# Patient Record
Sex: Male | Born: 1985
Health system: Southern US, Community
[De-identification: ages and names within clinical notes are randomized; demographics above are authoritative.]

## PROBLEM LIST (undated history)

## (undated) DIAGNOSIS — Z789 Other specified health status: Secondary | ICD-10-CM

## (undated) DIAGNOSIS — K760 Fatty (change of) liver, not elsewhere classified: Secondary | ICD-10-CM

## (undated) HISTORY — DX: Fatty (change of) liver, not elsewhere classified: K76.0

## (undated) HISTORY — PX: OTHER SURGICAL HISTORY: SHX169

## (undated) HISTORY — DX: Other specified health status: Z78.9

---

## 2016-05-10 ENCOUNTER — Ambulatory Visit: Admitting: Family Medicine

## 2016-05-10 NOTE — Progress Notes (Signed)
* * *        **[name]**    --- ---    56 Y old Male, DOB: 26-Mar-1986    Account Number: [id number]    [address] , [place], [place]    Home: [phone]    Guarantor: [name] Insurance: [employer] Payer ID: [id number]    Appointment Facility: [facility] [place]        * * *    05/10/2016  Progress Notes: Gaye Alken    --- ---    ---        Current Medications    ---      None    ---      Past Medical History    ---       PTSD.        ---      Surgical History    ---      Denies Past Surgical History    ---      Family History    ---      Father: alive 33 yrs    ---    Mother: alive 44 yrs    ---    2 brother(s) , 1 sister(s) .    ---    Brother deceased of overdose.    ---      Social History    ---     Violeta Gelinas 2017:_    Previsit Prep discussed with care team/Quality Folder Updated Last updated on:  05/10/2016. Self referrals since last visit \--> No. Hospital/ED/SNF stay  since last visit \--> No. BMI Follow-Up Weight Assessment Findings:  Overweight, BMI management provided Yes, Above Normal BMI Follow-up Dietary  management education, guidance, and counseling. Depression Screening (PHQ2)  Little interest or pleasure in doing things? No, Feeling down, depressed or  hopeless? Yes. Depression Screening (PHQ9) Little interest or pleasure in  doing things More than half the days, Feeling down, depressed, or hopeless Not  at all, Trouble falling or staying asleep, or sleeping too much More than half  the days, Feeling tired or having little energy Several days, Poor appetite or  overeating Not at all, Feeling bad about yourself, or that you are a failure,  or have let yourself or your family down Not at all, Trouble concentrating on  things, such as reading the newspaper or watching television Several days,  Moving or speaking so slowly that other people could have noticed. Or the  opposite being so fidgety or restless that you have been moving around a lot  more  than usual Not at all, Thoughts that you would be better off dead, or of  hurting yourself in some way Not at all, Total Score 6, Interpretation Mild  Depression. Smoking Are you a: nonsmoker.    _Family, Social and Culture:_    Alcohol Screening Did you have a drink containing alcohol in the past year?  Yes, How often did you have a drink containing alcohol in the past year? Two  to four times a month (2 points), How many drinks did you have on a typucal  day when you were drinking in the past year? 1 or 2 (0 points), How often did  you have six or more drinks on one occasion in the past year? Never (0  points), Points 2\. Communication Limitations: None. Education \--> finished  college. Exercise Frequency: Occasionally. Housing Number of adults in  household: 1\. Marital Status \--> single. Nutrition Diet no special  diet.  Occupation Employment Status: Employed, --> Production designer, theatre/television/film.    _Behavioral Health:_    Family History Family History Of Substance Abuse: No, Family History of Mental  Health: No.    _Sexual History:_    Sexual History Had sex in the past 12 months (vaginal, oral, or anal)? Yes,  with Women only.      Allergies    ---      N.K.D.A.    ---      Hospitalization/Major Diagnostic Procedure    ---      Denies Past Hospitalization    ---      Review of Systems    ---     _ANNUAL_ROS_ :    CONST: NEGATIVE FOR:, Fevers, Chills, Fatigue. EYES: NEGATIVE FOR: , Vision  changes, Eye Pain. EARS/NOSE/THROAT NEGATIVE FOR:, hearing loss , nasal  stuffiness. CV: NEGATIVE FOR:, Chest Pain, Palpitations . RESPIRATORY:  NEGATIVE FOR: , Cough, Excessive snoring, Wheeze. GI: NEGATIVE FOR: ,  abdominal pain , nausea, vomitting. NEURO: NEGATIVE FOR: , HA, Dizziness.            Reason for Appointment    ---      1\. Needs new pcp    ---      History of Present Illness    ---     _-HPI-_ :    Pt is here for establishment. Pt is not smoking. Pt lost his brother in April  and has been dealing with grief and would like to see  Psych. pt had hx of  PTSD. Pt has athlete foot and pt did take pill long time ago and would like to  try again.      Vital Signs    ---    BP sitting 117/72 mm Hg, Pulse sitting 66, Temp 98.6, Wt 198, Ht 68 in, BMI  30.10 Index, Oxygen sat % 98 %, Ht-cm 172.72 cm, Wt-kg 89.81.      Examination    ---     _Exam (R.G.)_ :    GENERAL APPEARANCE: no acute distress.    HEAD: normocephalic atraumatic.    SKIN: Couple of toe nails thickened. Macular rash on foot and between toes.  scaly.[email or web address] Kitchen    CARDIOVASCULAR: regular rate and rhythm, no murmur.    RESPIRATORY: clear to auscultation.    CVA tenderness: No.    EXTREMITIES no edema.    PSYCH: Good eye contact, appropriate affect, appropriate mood, alert.          Assessments    ---    1\. PTSD (post-traumatic stress disorder) - F43.10 (Primary)    ---    2\. Encounter for immunization - Z23    ---    3\. Fatigue, unspecified type - R53.83    ---    4\. Venereal disease screening - Z11.3    ---    5\. Tinea pedis of both feet - B35.3    ---      List of counselor given. will check STD's and want to be checked for mono.  Lamisil Rx. , **Medication side effects discussed with the patient and  understands, F/U 2wks for physical.    ---      Treatment    ---       **1\. Fatigue, unspecified type**    _LAB: COMPREHENSIVE METABOLIC PANEL_    _LAB: EBV ACUTE INFECTION ANTIBODIES_    ---         **2\. Venereal disease screening**    _LAB: CHLAMYDIA/GC APTIMA NAA,  URINE_    _LAB: HEPATITIS PANEL_    _LAB: HIV AG AB_    _LAB: HSV TYPE 2 SPECIFIC IGG_    _LAB: RPR TITER_         **3\. Tinea pedis of both feet**    Start Terbinafine HCl Tablet, 250 MG, 1 tablet, Orally, Once a day, 14 days,  14 Tablet, Refills 0      Immunization    ---      Tdap : 0.5 mL (Route: Intramuscular) given by Shary Key on Left Deltoid  (Encounter for immunization)    ---      Procedure Codes    ---      [phone] Tdap    ---    [phone] IMMUNIZATION ADMIN    ---      Follow Up    ---    2 Weeks (Reason: Annual)     Electronically signed by Gaye Alken , MD on 05/10/2016 at 02:00 PM EDT    Sign off status: Completed        * * [facility] [place]    [address]    [place], [place] [address]    Tel: [phone]    Fax: [phone]              * * *          Patient: Nadim, Malia DOB: 09-27-1985 Progress Note: Gaye Alken  05/10/2016    ---    Note generated by eClinicalWorks EMR/PM Software ([email or web address])

## 2016-05-12 LAB — HX COMPREHENSIVE METABOLIC PANEL
HX ALBUMIN: 4.6 g/dL (ref 3.5–5.0)
HX ALK PHOS TOTAL: 75 U/L (ref 25–165)
HX ALT/SGPT: 41 U/L (ref 10–44)
HX ANION GAP: 17 mmol/L (ref 9–18)
HX AST/SGOT: 21 U/L (ref 10–44)
HX BILIRUBIN TOTAL: 0.5 mg/dL (ref 0.2–1.0)
HX BUN: 8 mg/dL (ref 8–21)
HX CALCIUM: 9.7 mg/dL (ref 8.6–10.3)
HX CARBON DIOXIDE: 24 mmol/L (ref 22–30)
HX CHLORIDE: 102 mmol/L (ref 96–104)
HX CREATININE: 0.93 mg/dL (ref 0.67–1.17)
HX GLUCOSE: 103 mg/dL (ref 70–105)
HX POTASSIUM: 4.5 mmol/L (ref 3.5–5.1)
HX SODIUM: 143 mmol/L (ref 135–145)
HX TOTAL PROTEIN: 7.3 g/dL (ref 6.2–8.4)

## 2016-05-12 LAB — HX EBV ACUTE INFECTION ANTIBODIES
HX EBV AB VCA, IGM: <36 U/mL (ref 0.0–35.9)
HX EBV EARLY ANTIGEN AB, IGG: 11 U/mL — ABNORMAL HIGH (ref 0.0–8.9)
HX EBV NUCLEAR ANTIGEN AB, IGG: >600 U/mL — ABNORMAL HIGH (ref 0.0–17.9)
HX EBV-VCA IGG, AB: 83.3 U/mL — ABNORMAL HIGH (ref 0.0–17.9)

## 2016-05-12 LAB — HX HEPATITIS A AB IGM: HX HEPATITIS A AB IGM: NONREACTIVE

## 2016-05-12 LAB — HX HIV AG AB: HX HIV AG AB: NONREACTIVE

## 2016-05-12 LAB — HX HEPATITIS B CORE IGM: HX HEPATITIS CORE IGM: NONREACTIVE

## 2016-05-12 LAB — HX ANTI-TREPONEMAL ELISA: HX RPR: NEGATIVE

## 2016-05-12 LAB — HX HEPATITIS C AB: HX HEPATITIS C AB: NONREACTIVE

## 2016-05-12 LAB — HX HEPATITIS B ANTIGEN: HX HBSAG: NONREACTIVE

## 2016-05-12 LAB — HX HSV TYPE 2 SPECIFIC IGG: HX HSV2IGG: <0.91

## 2016-05-13 LAB — HX CHLAMYDIA/GC APTIMA NAA, URINE
HX C.TRACHOMATIS NAA: NEGATIVE
HX N.GONORRHOEAE NAA: NEGATIVE

## 2016-05-24 ENCOUNTER — Ambulatory Visit: Admitting: Family Medicine

## 2016-05-24 ENCOUNTER — Ambulatory Visit (HOSPITAL_BASED_OUTPATIENT_CLINIC_OR_DEPARTMENT_OTHER)

## 2016-05-24 NOTE — Progress Notes (Signed)
* * *        **Vincent Oconnor**    --- ---    13 Y old Male, DOB: March 17, 1986    Account Number: [id number]    [address] , [place], [place]    Home: [phone]    Guarantor: Vincent Oconnor Insurance:  [employer] Payer ID: [id number]    Appointment Facility: [facility] [place]        * * *    05/24/2016  Progress Notes: Gaye Alken    --- ---    ---        Current Medications    ---    Taking     * Terbinafine HCl 250 MG Tablet 1 tablet Orally Once a day, stop date 05/24/2016    ---    * Medication List reviewed and reconciled with the patient    ---      Past Medical History    ---       PTSD.        ---      Surgical History    ---      Denies Past Surgical History    ---      Family History    ---      Father: alive 28 yrs    ---    Mother: alive 29 yrs    ---    2 brother(s) , 1 sister(s) .    ---    Brother deceased of overdose.    ---      Social History    ---     Violeta Gelinas 2017:_    Previsit Prep discussed with care team/Quality Folder Updated Last updated on:  05/24/2016. Self referrals since last visit \--> No. Hospital/ED/SNF stay  since last visit \--> No. BMI Follow-Up Weight Assessment Findings:  Overweight, BMI management provided Yes, Above Normal BMI Follow-up Dietary  management education, guidance, and counseling. Depression Screening (PHQ2)  Little interest or pleasure in doing things? No, Feeling down, depressed or  hopeless? Yes. Depression Screening (PHQ9) Total Score 6, Feeling bad about  yourself, or that you are a failure, or have let yourself or your family down  Not at all, Feeling down, depressed, or hopeless Not at all, Feeling tired or  having little energy Several days, Interpretation Mild Depression, Little  interest or pleasure in doing things More than half the days, Moving or  speaking so slowly that other people could have noticed. Or the opposite being  so fidgety or restless that you have been moving around a lot more than usual  Not at all,  Poor appetite or overeating Not at all, Thoughts that you would be  better off dead, or of hurting yourself in some way Not at all, Trouble  concentrating on things, such as reading the newspaper or watching television  Several days, Trouble falling or staying asleep, or sleeping too much More  than half the days. Depression Follow-Up Plan Depression Screening Findings  Negative, Follow-Up for Depression Mental health care management. Smoking Are  you a: nonsmoker.    _Family, Social and Culture:_    Alcohol Screening Did you have a drink containing alcohol in the past year?  Yes, How often did you have six or more drinks on one occasion in the past  year? Never (0 points), How many drinks did you have on a typucal day when you  were drinking in the past year? 1 or 2 (0 points), How often did  you have a  drink containing alcohol in the past year? Two to four times a month (2  points), Points 2\. Communication Limitations: None. Education \--> finished  college. Exercise Frequency: Occasionally. Housing Number of adults in  household: 1\. Marital Status \--> single. Nutrition Diet no special diet.  Occupation Employment Status: Employed, --Scientist, research (medical).    _Behavioral Health:_    Family History Family History Of Substance Abuse: No, Family History of Mental  Health: No.    _Sexual History:_    Sexual History Had sex in the past 12 months (vaginal, oral, or anal)? Yes,  with Women only.      Allergies    ---      N.K.D.A.    ---      Hospitalization/Major Diagnostic Procedure    ---      Denies Past Hospitalization    ---      Review of Systems    ---     _ANNUAL_ROS_ :    CONST: NEGATIVE FOR:, Fevers, Chills, Fatigue. EYES: NEGATIVE FOR: , Vision  changes, Eye Pain. EARS/NOSE/THROAT NEGATIVE FOR:, hearing loss , nasal  stuffiness. CV: NEGATIVE FOR:, Chest Pain, Palpitations . RESPIRATORY:  NEGATIVE FOR: , Cough, Excessive snoring, Wheeze. GI: NEGATIVE FOR: ,  abdominal pain , nausea, vomitting. MSK: NEGATIVE FOR: ,  Myalgias, Back pain.  NEURO: NEGATIVE FOR: , HA, Dizziness.            Reason for Appointment    ---      1\. Ann    ---      History of Present Illness    ---     _-HPI-_ :    Pt is here for physical. Pt is not smoking. Pt has been on terbinafine and pt  stated it helped for his athlete's foot. Pt denied any CP, any SOB.      Vital Signs    ---    BP sitting 110/70 mm Hg, Pulse sitting 81, Temp 98.5, Wt 199, Ht 68 in, BMI  30.25 Index, Oxygen sat % 98 %, Weight Change 1 lb, Ht-cm 172.72 cm, Wt-kg  90.27.      Examination    ---     _Exam (R.G.)_ :    GENERAL APPEARANCE: no acute distress .    HEAD: normocephalic atraumatic .    SKIN: Couple of toe nails thickened. Macular rash on foot and between toes.  scaly. Improving.    EYES: conjunctiva clear, non-icteric.    EARS: canals ok, tympanic membranes normal.    Oropharynx Moist Mucous Membranes.    NECK: no cervical or supraclavicular lymphadenopathy, no thyromegaly.    CARDIOVASCULAR: regular rate and rhythm, no murmur .    RESPIRATORY: clear to auscultation .    CVA tenderness: No .    GASTROINTESTINAL: abdomen soft non-tender.    ABDOMEN: bowel sounds normal, soft non-tender, no HSM.    EXTREMITIES no edema .    PERIPHERAL PULSES: pedal pulses 2+ bilaterally.    MUSCULOSKELETAL No joint swelling.    PSYCH: Good eye contact, appropriate affect, appropriate mood, alert .          Assessments    ---    1\. Adult general medical examination - Z00.00 (Primary)    ---    2\. Tinea pedis of both feet - B35.3    ---    3\. Encounter for antibody response examination - Z01.84    ---    4\. Encounter for immunization - Z23    ---  Flu given. Pt works as Surveyor, minerals in shots. WIll check Hep B  titer. Annually. Encourage to exercise on regular basis.    ---      Treatment    ---       **1\. Adult general medical examination**    Notes: Health screening - men - ages 38 to 28 material was published to  portal.    ---        **2\. Tinea pedis of both feet**     Refill Terbinafine HCl Tablet, 250 MG, 1 tablet, Orally, Once a day, 14 days,  14 Tablet, Refills 0         **3\. Encounter for antibody response examination**    _LAB: HBS AB_       Immunization    ---      Influenza : 0.5 mL (Route: Intramuscular) given by Shary Key on Left  Deltoid (Encounter for immunization)    ---      Procedure Codes    ---      [id number] Flucelvax Quad 4 and older    ---    [id number] IMMUNIZATION ADMIN    ---      Follow Up    ---    1 Year (Reason: Annual)    Electronically signed by Gaye Alken , MD on 05/24/2016 at 03:30 PM EDT    Sign off status: Completed        * * [facility] [place]    [address]    [place], [place] [address]    Tel: [phone]    Fax: [phone]              * * *          Patient: Vincent Oconnor, Vincent Oconnor DOB: May 17, 1986 Progress Note: Gaye Alken  05/24/2016    ---    Note generated by eClinicalWorks EMR/PM Software ([email or web address])

## 2016-05-25 LAB — HX HEPATITIS B ANTIBODY
HX HEP BS AB UNIT: >1000 m[IU]/mL
HX HEP BS AB: REACTIVE

## 2016-06-10 ENCOUNTER — Ambulatory Visit: Admitting: Cardiovascular Disease

## 2016-06-11 ENCOUNTER — Ambulatory Visit

## 2016-06-13 ENCOUNTER — Ambulatory Visit

## 2017-05-24 ENCOUNTER — Ambulatory Visit: Admitting: Family Medicine

## 2017-05-24 NOTE — Progress Notes (Signed)
* * *        **  [name]**    --- ---    35 Y old Male, DOB: 04-02-1986    [address] , [place], [place], [place] [address]    Home: [phone]    Provider: Gaye Alken        * * *    Telephone Encounter    ---    Answered by   Blima Rich  Date: 05/24/2017         Time: 01:30 PM    Reason   Annual O/R    --- ---            Message                      sent                     * * *                ---          * * *          Patient: Dustin, Bumbaugh DOB: [birth year] Provider: Gaye Alken 05/24/2017    ---    Note generated by eClinicalWorks EMR/PM Software ([email or web address])

## 2017-11-15 ENCOUNTER — Telehealth: Payer: Self-pay | Admitting: General Practice

## 2017-11-15 NOTE — Telephone Encounter (Signed)
Left voicemail for pt to call us back to set up new patient appt.

## 2017-11-16 ENCOUNTER — Encounter: Payer: Self-pay | Admitting: Family Medicine

## 2017-11-16 ENCOUNTER — Ambulatory Visit: Payer: 59 | Admitting: Family Medicine

## 2017-11-16 VITALS — BP 104/68 | HR 56 | Temp 98.0°F | Ht 68.5 in | Wt 201.4 lb

## 2017-11-16 DIAGNOSIS — Z0000 Encounter for general adult medical examination without abnormal findings: Secondary | ICD-10-CM | POA: Diagnosis not present

## 2017-11-16 DIAGNOSIS — B353 Tinea pedis: Secondary | ICD-10-CM | POA: Diagnosis not present

## 2017-11-16 DIAGNOSIS — Z0001 Encounter for general adult medical examination with abnormal findings: Secondary | ICD-10-CM | POA: Diagnosis not present

## 2017-11-16 MED ORDER — KETOCONAZOLE 2 % EX CREA
TOPICAL_CREAM | CUTANEOUS | 0 refills | Status: AC
Start: 2017-11-16 — End: 2017-12-28

## 2017-11-16 MED FILL — KETOCONAZOLE 2% CREAM: 2 | 42 days supply | Qty: 60 | Fill #0

## 2017-11-16 NOTE — Patient Instructions (Addendum)
1-2 days for your labs.  Keep up the good work.   Let us know if you need anything.  EXERCISES  RANGE OF MOTION (ROM) AND STRETCHING EXERCISES - Low Back Prain Most people with lower back pain will find that their symptoms get worse with excessive bending forward (flexion) or arching at the lower back (extension). The exercises that will help resolve your symptoms will focus on the opposite motion.  If you have pain, numbness or tingling which travels down into your buttocks, leg or foot, the goal of the therapy is for these symptoms to move closer to your back and eventually resolve. Sometimes, these leg symptoms will get better, but your lower back pain may worsen. This is often an indication of progress in your rehabilitation. Be very alert to any changes in your symptoms and the activities in which you participated in the 24 hours prior to the change. Sharing this information with your caregiver will allow him or her to most efficiently treat your condition. These exercises may help you when beginning to rehabilitate your injury. Your symptoms may resolve with or without further involvement from your physician, physical therapist or athletic trainer. While completing these exercises, remember:   Restoring tissue flexibility helps normal motion to return to the joints. This allows healthier, less painful movement and activity.  An effective stretch should be held for at least 30 seconds.  A stretch should never be painful. You should only feel a gentle lengthening or release in the stretched tissue. FLEXION RANGE OF MOTION AND STRETCHING EXERCISES:  STRETCH - Flexion, Single Knee to Chest   Lie on a firm bed or floor with both legs extended in front of you.  Keeping one leg in contact with the floor, bring your opposite knee to your chest. Hold your leg in place by either grabbing behind your thigh or at your knee.  Pull until you feel a gentle stretch in your low back. Hold 30  seconds.  Slowly release your grasp and repeat the exercise with the opposite side. Repeat 2 times. Complete this exercise 3 times per week.   STRETCH - Flexion, Double Knee to Chest  Lie on a firm bed or floor with both legs extended in front of you.  Keeping one leg in contact with the floor, bring your opposite knee to your chest.  Tense your stomach muscles to support your back and then lift your other knee to your chest. Hold your legs in place by either grabbing behind your thighs or at your knees.  Pull both knees toward your chest until you feel a gentle stretch in your low back. Hold 30 seconds.  Tense your stomach muscles and slowly return one leg at a time to the floor. Repeat 2 times. Complete this exercise 3 times per week.   STRETCH - Low Trunk Rotation  Lie on a firm bed or floor. Keeping your legs in front of you, bend your knees so they are both pointed toward the ceiling and your feet are flat on the floor.  Extend your arms out to the side. This will stabilize your upper body by keeping your shoulders in contact with the floor.  Gently and slowly drop both knees together to one side until you feel a gentle stretch in your low back. Hold for 30 seconds.  Tense your stomach muscles to support your lower back as you bring your knees back to the starting position. Repeat the exercise to the other side. Repeat 2 times.  Complete this exercise at least 3 times per week.   EXTENSION RANGE OF MOTION AND FLEXIBILITY EXERCISES:  STRETCH - Extension, Prone on Elbows   Lie on your stomach on the floor, a bed will be too soft. Place your palms about shoulder width apart and at the height of your head.  Place your elbows under your shoulders. If this is too painful, stack pillows under your chest.  Allow your body to relax so that your hips drop lower and make contact more completely with the floor.  Hold this position for 30 seconds.  Slowly return to lying flat on the  floor. Repeat 2 times. Complete this exercise 3 times per week.   RANGE OF MOTION - Extension, Prone Press Ups  Lie on your stomach on the floor, a bed will be too soft. Place your palms about shoulder width apart and at the height of your head.  Keeping your back as relaxed as possible, slowly straighten your elbows while keeping your hips on the floor. You may adjust the placement of your hands to maximize your comfort. As you gain motion, your hands will come more underneath your shoulders.  Hold this position 30 seconds.  Slowly return to lying flat on the floor. Repeat 2 times. Complete this exercise 3 times per week.   RANGE OF MOTION- Quadruped, Neutral Spine   Assume a hands and knees position on a firm surface. Keep your hands under your shoulders and your knees under your hips. You may place padding under your knees for comfort.  Drop your head and point your tailbone toward the ground below you. This will round out your lower back like an angry cat. Hold this position for 30 seconds.  Slowly lift your head and release your tail bone so that your back sags into a large arch, like an old horse.  Hold this position for 30 seconds.  Repeat this until you feel limber in your low back.  Now, find your "sweet spot." This will be the most comfortable position somewhere between the two previous positions. This is your neutral spine. Once you have found this position, tense your stomach muscles to support your low back.  Hold this position for 30 seconds. Repeat 2 times. Complete this exercise 3 times per week.   STRENGTHENING EXERCISES - Low Back Sprain These exercises may help you when beginning to rehabilitate your injury. These exercises should be done near your "sweet spot." This is the neutral, low-back arch, somewhere between fully rounded and fully arched, that is your least painful position. When performed in this safe range of motion, these exercises can be used for people  who have either a flexion or extension based injury. These exercises may resolve your symptoms with or without further involvement from your physician, physical therapist or athletic trainer. While completing these exercises, remember:   Muscles can gain both the endurance and the strength needed for everyday activities through controlled exercises.  Complete these exercises as instructed by your physician, physical therapist or athletic trainer. Increase the resistance and repetitions only as guided.  You may experience muscle soreness or fatigue, but the pain or discomfort you are trying to eliminate should never worsen during these exercises. If this pain does worsen, stop and make certain you are following the directions exactly. If the pain is still present after adjustments, discontinue the exercise until you can discuss the trouble with your caregiver.  STRENGTHENING - Deep Abdominals, Pelvic Tilt   Lie on a firm  bed or floor. Keeping your legs in front of you, bend your knees so they are both pointed toward the ceiling and your feet are flat on the floor.  Tense your lower abdominal muscles to press your low back into the floor. This motion will rotate your pelvis so that your tail bone is scooping upwards rather than pointing at your feet or into the floor. With a gentle tension and even breathing, hold this position for 3 seconds. Repeat 2 times. Complete this exercise 3 times per week.   STRENGTHENING - Abdominals, Crunches   Lie on a firm bed or floor. Keeping your legs in front of you, bend your knees so they are both pointed toward the ceiling and your feet are flat on the floor. Cross your arms over your chest.  Slightly tip your chin down without bending your neck.  Tense your abdominals and slowly lift your trunk high enough to just clear your shoulder blades. Lifting higher can put excessive stress on the lower back and does not further strengthen your abdominal  muscles.  Control your return to the starting position. Repeat 2 times. Complete this exercise 3 times per week.   STRENGTHENING - Quadruped, Opposite UE/LE Lift   Assume a hands and knees position on a firm surface. Keep your hands under your shoulders and your knees under your hips. You may place padding under your knees for comfort.  Find your neutral spine and gently tense your abdominal muscles so that you can maintain this position. Your shoulders and hips should form a rectangle that is parallel with the floor and is not twisted.  Keeping your trunk steady, lift your right hand no higher than your shoulder and then your left leg no higher than your hip. Make sure you are not holding your breath. Hold this position for 30 seconds.  Continuing to keep your abdominal muscles tense and your back steady, slowly return to your starting position. Repeat with the opposite arm and leg. Repeat 2 times. Complete this exercise 3 times per week.   STRENGTHENING - Abdominals and Quadriceps, Straight Leg Raise   Lie on a firm bed or floor with both legs extended in front of you.  Keeping one leg in contact with the floor, bend the other knee so that your foot can rest flat on the floor.  Find your neutral spine, and tense your abdominal muscles to maintain your spinal position throughout the exercise.  Slowly lift your straight leg off the floor about 6 inches for a count of 3, making sure to not hold your breath.  Still keeping your neutral spine, slowly lower your leg all the way to the floor. Repeat this exercise with each leg 2 times. Complete this exercise 3 times per week.  POSTURE AND BODY MECHANICS CONSIDERATIONS - Low Back Sprain Keeping correct posture when sitting, standing or completing your activities will reduce the stress put on different body tissues, allowing injured tissues a chance to heal and limiting painful experiences. The following are general guidelines for improved  posture.  While reading these guidelines, remember:  The exercises prescribed by your provider will help you have the flexibility and strength to maintain correct postures.  The correct posture provides the best environment for your joints to work. All of your joints have less wear and tear when properly supported by a spine with good posture. This means you will experience a healthier, less painful body.  Correct posture must be practiced with all of your activities,  especially prolonged sitting and standing. Correct posture is as important when doing repetitive low-stress activities (typing) as it is when doing a single heavy-load activity (lifting).  RESTING POSITIONS Consider which positions are most painful for you when choosing a resting position. If you have pain with flexion-based activities (sitting, bending, stooping, squatting), choose a position that allows you to rest in a less flexed posture. You would want to avoid curling into a fetal position on your side. If your pain worsens with extension-based activities (prolonged standing, working overhead), avoid resting in an extended position such as sleeping on your stomach. Most people will find more comfort when they rest with their spine in a more neutral position, neither too rounded nor too arched. Lying on a non-sagging bed on your side with a pillow between your knees, or on your back with a pillow under your knees will often provide some relief. Keep in mind, being in any one position for a prolonged period of time, no matter how correct your posture, can still lead to stiffness.  PROPER SITTING POSTURE In order to minimize stress and discomfort on your spine, you must sit with correct posture. Sitting with good posture should be effortless for a healthy body. Returning to good posture is a gradual process. Many people can work toward this most comfortably by using various supports until they have the flexibility and strength to  maintain this posture on their own. When sitting with proper posture, your ears will fall over your shoulders and your shoulders will fall over your hips. You should use the back of the chair to support your upper back. Your lower back will be in a neutral position, just slightly arched. You may place a small pillow or folded towel at the base of your lower back for  support.  When working at a desk, create an environment that supports good, upright posture. Without extra support, muscles tire, which leads to excessive strain on joints and other tissues. Keep these recommendations in mind:  CHAIR:  A chair should be able to slide under your desk when your back makes contact with the back of the chair. This allows you to work closely.  The chair's height should allow your eyes to be level with the upper part of your monitor and your hands to be slightly lower than your elbows.  BODY POSITION  Your feet should make contact with the floor. If this is not possible, use a foot rest.  Keep your ears over your shoulders. This will reduce stress on your neck and low back.  INCORRECT SITTING POSTURES  If you are feeling tired and unable to assume a healthy sitting posture, do not slouch or slump. This puts excessive strain on your back tissues, causing more damage and pain. Healthier options include:  Using more support, like a lumbar pillow.  Switching tasks to something that requires you to be upright or walking.  Talking a brief walk.  Lying down to rest in a neutral-spine position.  PROLONGED STANDING WHILE SLIGHTLY LEANING FORWARD  When completing a task that requires you to lean forward while standing in one place for a long time, place either foot up on a stationary 2-4 inch high object to help maintain the best posture. When both feet are on the ground, the lower back tends to lose its slight inward curve. If this curve flattens (or becomes too large), then the back and your other joints  will experience too much stress, tire more quickly, and can  cause pain.  CORRECT STANDING POSTURES Proper standing posture should be assumed with all daily activities, even if they only take a few moments, like when brushing your teeth. As in sitting, your ears should fall over your shoulders and your shoulders should fall over your hips. You should keep a slight tension in your abdominal muscles to brace your spine. Your tailbone should point down to the ground, not behind your body, resulting in an over-extended swayback posture.   INCORRECT STANDING POSTURES  Common incorrect standing postures include a forward head, locked knees and/or an excessive swayback. WALKING Walk with an upright posture. Your ears, shoulders and hips should all line-up.  PROLONGED ACTIVITY IN A FLEXED POSITION When completing a task that requires you to bend forward at your waist or lean over a low surface, try to find a way to stabilize 3 out of 4 of your limbs. You can place a hand or elbow on your thigh or rest a knee on the surface you are reaching across. This will provide you more stability, so that your muscles do not tire as quickly. By keeping your knees relaxed, or slightly bent, you will also reduce stress across your lower back. CORRECT LIFTING TECHNIQUES  DO :  Assume a wide stance. This will provide you more stability and the opportunity to get as close as possible to the object which you are lifting.  Tense your abdominals to brace your spine. Bend at the knees and hips. Keeping your back locked in a neutral-spine position, lift using your leg muscles. Lift with your legs, keeping your back straight.  Test the weight of unknown objects before attempting to lift them.  Try to keep your elbows locked down at your sides in order get the best strength from your shoulders when carrying an object.     Always ask for help when lifting heavy or awkward objects. INCORRECT LIFTING TECHNIQUES DO NOT:    Lock your knees when lifting, even if it is a small object.  Bend and twist. Pivot at your feet or move your feet when needing to change directions.  Assume that you can safely pick up even a paperclip without proper posture.

## 2017-11-16 NOTE — Progress Notes (Signed)
Pre visit review using our clinic review tool, if applicable. No additional management support is needed unless otherwise documented below in the visit note. 

## 2017-11-16 NOTE — Progress Notes (Signed)
Chief Complaint  Patient presents with  . Establish Care    Well Male Jimmy Barrett is here for a complete physical.   His last physical was >1 year ago.  Current diet: in general, a "healthy" diet   Current exercise: Some weight lifting, cardio 5 days/week Weight trend: stable, trying to lose some  Does pt snore? Yes, no witnessed apneic episodes. Daytime fatigue? No. Seat belt? Yes.    Health maintenance Tetanus- Yes Apr 12, 2016 HIV- Yes - Nov 16, 2004  History reviewed. No pertinent past medical history.  Past Surgical History:  Procedure Laterality Date  . OTHER SURGICAL HISTORY     [place] from [place]   Medications  Takes no meds routinely.  Allergies No Known Allergies   Family History Family History  Problem Relation Age of Onset  . Cancer Father        colon cancer    Review of Systems: Constitutional: no fevers or chills Eye:  no recent significant change in vision Ear/Nose/Mouth/Throat:  Ears:  no tinnitus or recent hearing loss Nose/Mouth/Throat:  no complaints of nasal congestion or bleeding, no sore throat Cardiovascular:  no chest pain, no palpitations Respiratory:  no cough and no shortness of breath Gastrointestinal:  no abdominal pain, no change in bowel habits, no nausea, vomiting, diarrhea, or constipation and no black or bloody stool GU:  Male: negative for dysuria, frequency, and incontinence and negative for prostate symptoms Musculoskeletal/Extremities:  no pain, redness, or swelling of the joints Integumentary (Skin/Breast): +athlete's foot on both feet; otherwise no abnormal skin lesions reported Neurologic:  no headaches, no numbness, tingling Endocrine: No unexpected weight changes Hematologic/Lymphatic:  no night sweats  Exam BP 104/68 (BP Location: Left Arm, Patient Position: Sitting, Cuff Size: Large)   Pulse (!) 56   Temp 98 F (36.7 C) (Oral)   Ht 5' 8.5" (1.74 m)   Wt 201 lb 6 oz (91.3 kg)   SpO2 96%    BMI 30.17 kg/m  General:  well developed, well nourished, in no apparent distress Skin:  no significant moles, warts, or growths Head:  no masses, lesions, or tenderness Eyes:  pupils equal and round, sclera anicteric without injection Ears:  canals without lesions, TMs shiny without retraction, no obvious effusion, no erythema Nose:  nares patent, septum midline, mucosa normal Throat/Pharynx:  lips and gingiva without lesion; tongue and uvula midline; non-inflamed pharynx; no exudates or postnasal drainage Neck: neck supple without adenopathy, thyromegaly, or masses Lungs:  clear to auscultation, breath sounds equal bilaterally, no respiratory distress Cardio:  regular rate and rhythm without murmurs, heart sounds without clicks or rubs Abdomen:  abdomen soft, nontender; bowel sounds normal; no masses or organomegaly Genital (male): circumcised penis, no lesions or discharge; testes present bilaterally without masses or tenderness Rectal: Deferred Musculoskeletal:  symmetrical muscle groups noted without atrophy or deformity Extremities:  no clubbing, cyanosis, or edema, no deformities, no skin discoloration Neuro:  gait normal; deep tendon reflexes normal and symmetric Psych: well oriented with normal range of affect and appropriate judgment/insight  Assessment and Plan  Well adult exam - Plan: Comprehensive metabolic panel, Lipid panel  Tinea pedis of both feet - Plan: ketoconazole (NIZORAL) 2 % cream   Well 32 y.o. male. Counseled on diet and exercise. Doing well as of late. Other orders as above. Follow up in 1 year pending the above workup. The patient voiced understanding and agreement to the plan.  Jilda Rocheicholas Paul FarlingtonWendling, DO 11/16/17 3:03 PM

## 2017-11-17 LAB — LIPID PANEL
CHOL/HDL RATIO: 6
Cholesterol: 174 mg/dL (ref 0–200)
HDL: 31 mg/dL — ABNORMAL LOW (ref >39.00)
LDL CALC: 104 mg/dL — AB (ref 0–99)
NonHDL: 142.67
Triglycerides: 192 mg/dL — ABNORMAL HIGH (ref 0.0–149.0)
VLDL: 38.4 mg/dL (ref 0.0–40.0)

## 2017-11-17 LAB — COMPREHENSIVE METABOLIC PANEL
ALT: 45 U/L (ref 0–53)
AST: 22 U/L (ref 0–37)
Albumin: 4.4 g/dL (ref 3.5–5.2)
Alkaline Phosphatase: 58 U/L (ref 39–117)
BILIRUBIN TOTAL: 0.9 mg/dL (ref 0.2–1.2)
BUN: 15 mg/dL (ref 6–23)
CALCIUM: 9.5 mg/dL (ref 8.4–10.5)
CHLORIDE: 105 meq/L (ref 96–112)
CO2: 26 meq/L (ref 19–32)
CREATININE: 0.88 mg/dL (ref 0.40–1.50)
GFR: 106.52 mL/min (ref >60.00)
GLUCOSE: 86 mg/dL (ref 70–99)
Potassium: 3.7 mEq/L (ref 3.5–5.1)
SODIUM: 140 meq/L (ref 135–145)
Total Protein: 7.2 g/dL (ref 6.0–8.3)

## 2018-01-06 ENCOUNTER — Other Ambulatory Visit: Payer: Self-pay

## 2018-01-06 ENCOUNTER — Emergency Department (HOSPITAL_COMMUNITY): Payer: PRIVATE HEALTH INSURANCE

## 2018-01-06 ENCOUNTER — Emergency Department (HOSPITAL_COMMUNITY)
Admission: EM | Admit: 2018-01-06 | Discharge: 2018-01-06 | Disposition: A | Discharge status: Home / Self Care | Payer: PRIVATE HEALTH INSURANCE | Attending: Emergency Medicine | Admitting: Emergency Medicine

## 2018-01-06 ENCOUNTER — Encounter (HOSPITAL_COMMUNITY): Payer: Self-pay | Admitting: Emergency Medicine

## 2018-01-06 DIAGNOSIS — Y990 Civilian activity done for income or pay: Secondary | ICD-10-CM | POA: Insufficient documentation

## 2018-01-06 DIAGNOSIS — M7532 Calcific tendinitis of left shoulder: Secondary | ICD-10-CM | POA: Insufficient documentation

## 2018-01-06 DIAGNOSIS — Y9389 Activity, other specified: Secondary | ICD-10-CM | POA: Diagnosis not present

## 2018-01-06 DIAGNOSIS — W010XXA Fall on same level from slipping, tripping and stumbling without subsequent striking against object, initial encounter: Secondary | ICD-10-CM | POA: Insufficient documentation

## 2018-01-06 DIAGNOSIS — Y92239 Unspecified place in hospital as the place of occurrence of the external cause: Secondary | ICD-10-CM | POA: Diagnosis not present

## 2018-01-06 DIAGNOSIS — M25512 Pain in left shoulder: Secondary | ICD-10-CM | POA: Diagnosis present

## 2018-01-06 NOTE — ED Notes (Signed)
Pt has been evaluated by Irving Burton  PA, awaiting for discharge.

## 2018-01-06 NOTE — ED Triage Notes (Signed)
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

## 2018-01-06 NOTE — Discharge Instructions (Addendum)
X-ray was reassuring.  No broken bones.  Please take 600 mg ibuprofen every 6 hours as needed for pain.  You can also take Tylenol.  Please apply ice over the shoulder at least twice a day for 15 minutes at a time.  Schedule an appointment with the orthopedic doctor in a week if your symptoms are not improving.

## 2018-01-06 NOTE — ED Provider Notes (Signed)
[facility]-EMERGENCY DEPT Provider Note   CSN: [id number] Arrival date & time: 01/06/18  0542     History   Chief Complaint Chief Complaint  Patient presents with  . Shoulder Pain    HPI Jimmy Barrett is a 32 y.o. male.  HPI   Jimmy. Barrett is a 32yo male with no significant past medical history who presents to the emergency department for evaluation of left shoulder injury.  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  Reports he fell on his left shoulder.  Initially he felt okay, but states about 30 minutes after falling he felt generalized left shoulder soreness.  States that his shoulder pain is located generally over the head of the humerus and is a 4/10 in severity at this time. Pain is worsened when he abducts the shoulder beyond 45 degrees. He has not taken any medication for the pain. He denies numbness, weakness, open wound, fever. Denies hitting his head or LOC.   History reviewed. No pertinent past medical history.  There are no active problems to display for this patient.   Past Surgical History:  Procedure Laterality Date  . OTHER SURGICAL HISTORY     Shrapnel removed from leg in [place]        Home Medications    Prior to Admission medications   Not on File    Family History Family History  Problem Relation Age of Onset  . Cancer Father        colon cancer    Social History Social History   Tobacco Use  . Smoking status: Never Smoker  . Smokeless tobacco: Never Used  Substance Use Topics  . Alcohol use: Yes    Comment: socially  . Drug use: No     Allergies   Patient has no known allergies.   Review of Systems Review of Systems  Constitutional: Negative for chills and fever.  Musculoskeletal: Positive for arthralgias (left shoulder). Negative for joint swelling.  Skin: Negative for color change, rash and wound.  Neurological: Negative for  weakness and numbness.     Physical Exam Updated Vital Signs BP 131/82 (BP Location: Right Arm)   Pulse 63   Resp 16   Ht  (1.727 m)   Wt 81.6 kg (180 lb)   SpO2 98%   BMI 27.37 kg/m   Physical Exam  Constitutional: He is oriented to person, place, and time. He appears well-developed and well-nourished. No distress.  HENT:  Head: Normocephalic and atraumatic.  Eyes: Right eye exhibits no discharge. Left eye exhibits no discharge.  Pulmonary/Chest: Effort normal. No respiratory distress.  Musculoskeletal:  Left shoulder non-tender to palpation. Full active ROM, although he reports tenderness with abduction. Negative empty can test, negative Neer's, negative Lift off. No swelling, erythema or ecchymosis present. No step-off, crepitus, or deformity appreciated. 5/5 muscle strength of UE. 2+ radial pulse. Sensation to light touch intact in bilateral UE.   Neurological: He is alert and oriented to person, place, and time. Coordination normal.  Skin: Skin is warm and dry. Capillary refill takes less than 2 seconds. He is not diaphoretic.  Psychiatric: He has a normal mood and affect. His behavior is normal.  Nursing note and vitals reviewed.    ED Treatments / Results  Labs (all labs ordered are listed, but only abnormal results are displayed) Labs Reviewed - No data to display  EKG None  Radiology Dg  Shoulder Left  Result Date: 01/06/2018 CLINICAL DATA:  Left shoulder pain after injury. History of previous dislocations. EXAM: LEFT SHOULDER - 2+ VIEW COMPARISON:  None. FINDINGS: There is a tiny osseous fragment in the subacromial space. This could represent an acute or old avulsion fragment or it could indicate calcific tendinosis. There is no acute displaced fracture identified. No dislocation of the glenohumeral or acromioclavicular joints. No focal bone lesion or bone destruction. Soft tissues are unremarkable. IMPRESSION: Nonspecific osseous fragment in the subacromial  space could represent acute or old avulsion or calcific tendinitis. Otherwise, no acute fracture or dislocation. Electronically Signed   By: Burman Nieves M.D.   On: 01/06/2018 06:13    Procedures Procedures (including critical care time)  Medications Ordered in ED Medications - No data to display   Initial Impression / Assessment and Plan / ED Course  I have reviewed the triage vital signs and the nursing notes.  Pertinent labs & imaging results that were available during my care of the patient were reviewed by me and considered in my medical decision making (see chart for details).     X-ray without acute fracture.  It does show a small nonspecific osseous fragment, likely calcific tendinitis.  Exam consistent with tendinitis, likely supraspinatus given pain with supination past 45 degrees.  Right upper extremity neurovascularly intact.  No erythema, warmth or signs of infection.  Offered patient sling for comfort, he declines.  Discussed pain management with NSAIDs and RICE protocol. F/u with ortho in a week if not improving, provided him with this information his discharge paperwork.  Patient agrees and voiced understanding to the above plan and has no complaints prior to discharge.  Final Clinical Impressions(s) / ED Diagnoses   Final diagnoses:  Calcific tendonitis of left shoulder    ED Discharge Orders    None       [name] 01/06/18 1615    Geoffery Lyons, MD 01/06/18 2321

## 2018-01-19 MED FILL — NAPROXEN 500 MG TABLET: 500 | 30 days supply | Qty: 60 | Fill #0

## 2018-01-23 ENCOUNTER — Other Ambulatory Visit: Payer: Self-pay

## 2018-01-23 ENCOUNTER — Encounter: Payer: Self-pay | Admitting: Physical Therapy

## 2018-01-23 ENCOUNTER — Ambulatory Visit: Payer: PRIVATE HEALTH INSURANCE | Attending: Orthopedic Surgery | Admitting: Physical Therapy

## 2018-01-23 DIAGNOSIS — M25612 Stiffness of left shoulder, not elsewhere classified: Secondary | ICD-10-CM | POA: Insufficient documentation

## 2018-01-23 DIAGNOSIS — M6281 Muscle weakness (generalized): Secondary | ICD-10-CM | POA: Insufficient documentation

## 2018-01-23 DIAGNOSIS — M25512 Pain in left shoulder: Secondary | ICD-10-CM | POA: Insufficient documentation

## 2018-01-23 DIAGNOSIS — R29898 Other symptoms and signs involving the musculoskeletal system: Secondary | ICD-10-CM | POA: Diagnosis present

## 2018-01-23 NOTE — Therapy (Signed)
[facility] Outpatient Rehabilitation [facility] [address]  [address] [place], [place], [address] Phone: [phone]   Fax:  [phone]  Physical Therapy Evaluation  Patient Details  Name: Jimmy Barrett MRN: [id number] Date of Birth: 1985-12-27 Referring Provider: Jones Broom, MD   Encounter Date: 01/23/2018  PT End of Session - 01/23/18 1520    Visit Number  1    Number of Visits  5    Date for PT Re-Evaluation  02/06/18    Authorization Type  Workers comp- 1 eval, 4 visits    Authorization - Visit Number  1    Authorization - Number of Visits  5    PT Start Time  1405    PT Stop Time  1502    PT Time Calculation (min)  57 min    Activity Tolerance  Patient tolerated treatment well    Behavior During Therapy  [facility] for tasks assessed/performed       History reviewed. No pertinent past medical history.  Past Surgical History:  Procedure Laterality Date  . OTHER SURGICAL HISTORY     Shrapnel removed from leg in [place]    There were no vitals filed for this visit.   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 Reports that it has gotten worse since then; sleeping has become difficult as well as lifting. Was given a sling to use PRN; hasn't used it in 3 days and it has been at its worst these past 3 days. Patient reports he is on an "extra strength Advil-type medication" that he takes 2x a day but not able to recall name of medication.  Reports he is on worker's comp until ready to go back to work- not on [employer] duty. Denies previous injury to L shoulder. Pain at best: 0/10, pain at worst: 7-8/10.    Limitations  Lifting;House hold activities    Diagnostic tests  x-ray: 01/06/18 L shoulder- "tiny osseous fragment in subacromial space, could be an old avulsion fx or calcific  tendonitis."    Patient Stated Goals  get back to work and figure out what is wrong with my arm    Currently in Pain?  Yes    Pain Score  5     Pain Location  Shoulder    Pain Orientation  Left    Pain Descriptors / Indicators  Aching;Throbbing;Numbness;Sharp    Pain Type  Acute pain    Pain Radiating Towards  intermittent hand numbness    Pain Onset  1 to 4 weeks ago    Aggravating Factors   sleeping, lifitng, moving    Pain Relieving Factors  medication, ice         [facility] PT Assessment - 01/23/18 0001      Assessment   Medical Diagnosis  L shoulder strain    Referring Provider  Jones Broom, MD    Onset Date/Surgical Date  01/06/18    Hand Dominance  Right    Next MD Visit  02/02/18    Prior Therapy  No      Precautions   Precautions  None      Restrictions   Weight Bearing Restrictions  No      Balance Screen   Has the patient fallen in the past 6 months  Yes Initial injury  Has the patient had a decrease in activity level because of a fear of falling?   No    Is the patient reluctant to leave their home because of a fear of falling?   No      Home Environment   Living Environment  Private residence    Living Arrangements  Alone    Type of Home  Apartment    Home Access  Stairs to enter    Entrance Stairs-Number of Steps  3    Entrance Stairs-Rails  Left    Home Layout  One level      Prior Function   Level of Independence  Independent with basic ADLs    Vocation  Full time employment    Scientist, forensic guard     Leisure  Going back to gym      Cognition   Overall Cognitive Status  Within Functional Limits for tasks assessed      Observation/Other Assessments   Focus on Therapeutic Outcomes (FOTO)   shoulder: 59 (41% limited, 26% predicted)      Sensation   Light Touch  Appears Intact      Coordination   Gross Motor Movements are Fluid and Coordinated  Yes      Posture/Postural Control   Posture/Postural Control  Postural  limitations    Postural Limitations  Rounded Shoulders;Forward head;Weight shift left slumped in sitting      ROM / Strength   AROM / PROM / Strength  AROM;PROM;Strength      AROM   AROM Assessment Site  Shoulder;Elbow;Wrist    Right/Left Shoulder  Left;Right    Right Shoulder Flexion  175 Degrees    Right Shoulder ABduction  168 Degrees    Right Shoulder Internal Rotation  56 Degrees    Right Shoulder External Rotation  40 Degrees    Left Shoulder Flexion  158 Degrees    Left Shoulder ABduction  170 Degrees    Left Shoulder Internal Rotation  60 Degrees    Left Shoulder External Rotation  38 Degrees      PROM   PROM Assessment Site  Shoulder    Right/Left Shoulder  Right;Left    Right Shoulder Flexion  175 Degrees    Right Shoulder ABduction  175 Degrees    Right Shoulder Internal Rotation  54 Degrees    Right Shoulder External Rotation  65 Degrees    Left Shoulder Flexion  165 Degrees    Left Shoulder ABduction  175 Degrees    Left Shoulder Internal Rotation  66 Degrees    Left Shoulder External Rotation  44 Degrees      Strength   Strength Assessment Site  Shoulder;Elbow    Right/Left Shoulder  Right;Left    Right Shoulder Flexion  4+/5    Right Shoulder ABduction  4/5 scaption 4+/5    Right Shoulder Internal Rotation  4+/5    Right Shoulder External Rotation  4+/5    Left Shoulder Flexion  4/5    Left Shoulder ABduction  4/5 scaption 4-/5    Left Shoulder Internal Rotation  4+/5    Left Shoulder External Rotation  4/5      Flexibility   Soft Tissue Assessment /Muscle Length  --    Hamstrings  B pec soft tissue restriction      Special Tests    Special Tests  Rotator Cuff Impingement    Other special tests  hawkins kennedy    Rotator Cuff Impingment tests  Leanord Asal test;Empty Can test      Hawkins-Kennedy test   Findings  Positive    Side  Left    Comments  pain in supraspinatus       Empty Can test   Findings  Positive    Side  Left    Comment   -- pain and weak                 Objective measurements completed on examination: See above findings.              PT Education - 01/23/18 1519    Education provided  Yes    Education Details  prognosis, POC, HEP    Person(s) Educated  Patient    Methods  Explanation;Demonstration;Tactile cues;Verbal cues;Handout    Comprehension  Verbalized understanding       PT Short Term Goals - 01/23/18 1724      PT SHORT TERM GOAL #1   Title  Patient to be independent with initial HEP.    Time  1    Period  Weeks    Status  New    Target Date  01/30/18        PT Long Term Goals - 01/23/18 1727      PT LONG TERM GOAL #1   Title  Patient to be indpendent with advanced HEP.    Time  2    Period  Weeks    Status  New      PT LONG TERM GOAL #2   Title  Patient to demonstrate L UE flexion, scaption, IR WFL in order promote functional use of UE at work.    Time  2    Period  Weeks    Status  New      PT LONG TERM GOAL #3   Title  Patient to perform R sidelying without pain in order to allow for pain-free sleep.    Time  2    Period  Weeks    Status  New    Target Date  02/06/18      PT LONG TERM GOAL #4   Title  Patient to demonstrate >=4+/5 L UE strength in order to perform work duties.    Time  2    Period  Weeks    Status  New    Target Date  02/06/18             Plan - 01/23/18 1714    Clinical Impression Statement  Patient is a 31y/o M presenting to OPPT after falling on his L shoulder at work. 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 Patient reports pain had gotten worse since this incident, causing intermittent numbness to L forearm and hand. Has not used his sling for the past couple of days. Patient with positive Leanord Asal and empty can testing- c/o pain at L supraspinatus. Patient with limited flexion and scaption AROM, and reports pain at end range IR. Presents with the  following impairments: decreased ROM, decreased strength, impaired posture, impaired functional activity tolerance, pain. Would benefit from skilled PT services 2x/week for 2 weeks in order to address aforementioned impairments. Patient educated on and received handout on HEP. Advised not to push into pain for any exercises and to use ice on L shoulder as needed.    Clinical Presentation  Evolving    Clinical Decision Making  Low    Rehab Potential  Good  Clinical Impairments Affecting Rehab Potential  acute nature of symptoms     PT Frequency  2x / week    PT Duration  2 weeks    PT Treatment/Interventions  ADLs/Self Care Home Management;Cryotherapy;Electrical Stimulation;Ultrasound;Moist Heat;Iontophoresis /ml Dexamethasone;Therapeutic activities;Therapeutic exercise;Patient/family education;Manual techniques;Vasopneumatic Device;Taping;Splinting;Passive range of motion;Dry needling    PT Next Visit Plan  Reassess HEP, progress to mild scapular strengthening as tolerated; pec STM and/or stretch    Consulted and Agree with Plan of Care  Patient       Patient will benefit from skilled therapeutic intervention in order to improve the following deficits and impairments:  Pain, Postural dysfunction, Decreased activity tolerance, Decreased range of motion, Decreased strength, Impaired UE functional use, Impaired flexibility  Visit Diagnosis: Acute pain of left shoulder  Stiffness of left shoulder, not elsewhere classified  Other symptoms and signs involving the musculoskeletal system  Muscle weakness (generalized)     Problem List There are no active problems to display for this patient.   Anette Guarneri, PT, DPT 01/23/18 5:31 PM   [facility] [address]  [address] [place], [place], [address] Phone: [phone]   Fax:  [phone]  Name: MILFRED KRAMMES MRN: [id number] Date of Birth: 1986/01/07

## 2018-01-26 ENCOUNTER — Ambulatory Visit: Payer: PRIVATE HEALTH INSURANCE | Admitting: Physical Therapy

## 2018-01-26 ENCOUNTER — Encounter: Payer: Self-pay | Admitting: Physical Therapy

## 2018-01-26 DIAGNOSIS — R29898 Other symptoms and signs involving the musculoskeletal system: Secondary | ICD-10-CM

## 2018-01-26 DIAGNOSIS — M25612 Stiffness of left shoulder, not elsewhere classified: Secondary | ICD-10-CM

## 2018-01-26 DIAGNOSIS — M25512 Pain in left shoulder: Secondary | ICD-10-CM

## 2018-01-26 DIAGNOSIS — M6281 Muscle weakness (generalized): Secondary | ICD-10-CM

## 2018-01-26 NOTE — Therapy (Signed)
[facility] Outpatient Rehabilitation [facility] [address]  [address] [place], [place], [address] Phone: [phone]   Fax:  [phone]  Physical Therapy Treatment  Patient Details  Name: Jimmy Barrett MRN: [id number] Date of Birth: 11-Mar-1986 Referring Provider: Jones Broom, MD   Encounter Date: 01/26/2018  PT End of Session - 01/26/18 1037    Visit Number  2    Number of Visits  5    Date for PT Re-Evaluation  02/06/18    Authorization Type  Workers comp- 1 eval, 4 visits    Authorization - Visit Number  2    Authorization - Number of Visits  5    PT Start Time  [phone]    PT Stop Time  1028    PT Time Calculation (min)  51 min    Activity Tolerance  Patient tolerated treatment well    Behavior During Therapy  [facility] for tasks assessed/performed       History reviewed. No pertinent past medical history.  Past Surgical History:  Procedure Laterality Date  . OTHER SURGICAL HISTORY     Shrapnel removed from leg in [place]    There were no vitals filed for this visit.  Subjective Assessment - 01/26/18 0939    Subjective  Reports he was on light duty yesterday did 4 hours sitting at a desk and his L shoulder was killing him. Excruciating pain; took his meds and it helped some. Wore sling for an hour and that helped but not for long. HEP has been going okay, ROM is getting better but pain is still there. Has MD appoint next Friday and wants to ask for MRI.    Diagnostic tests  x-ray: 01/06/18 L shoulder- "tiny osseous fragment in subacromial space, could be an old avulsion fx or calcific tendonitis."    Patient Stated Goals  get back to work and figure out what is wrong with my arm    Currently in Pain?  Yes    Pain Score  5     Pain Location  Shoulder    Pain Orientation  Left                       [facility] Adult PT Treatment/Exercise - 01/26/18 0001      Exercises   Exercises  Shoulder      Shoulder Exercises: Supine   Flexion  Left;10 reps;Limitations    Flexion Limitations  scaption with thumb up    ABduction  Left;10 reps      Shoulder Exercises: Prone   Retraction  Left;10 reps;Strengthening;Limitations    Retraction Limitations  prone row elbow at side; 2 sets; VC/TCs to promote scap retraction      Shoulder Exercises: Sidelying   External Rotation  Left;AROM;Limitations;10 reps    External Rotation Limitations  elbow in neutral with towel; cues to slow speed      Shoulder Exercises: Standing   Horizontal ABduction  Both;Strengthening;10 reps    Theraband Level (Shoulder Horizontal ABduction)  Level 1 (Yellow)    Horizontal ABduction Limitations  TC/VCs to promote scap retraction      Shoulder Exercises: Stretch   Other Shoulder Stretches  L pec stretch in doorframe; 2x20 sec      Modalities   Modalities  Vasopneumatic      Vasopneumatic   Number Minutes Vasopneumatic   10 minutes    Vasopnuematic Location   Shoulder    Vasopneumatic Pressure  Low  Vasopneumatic Temperature   Coldest temp      Manual Therapy   Manual Therapy  Soft tissue mobilization;Joint mobilization;Passive ROM    Joint Mobilization  Grade III L shoulder pos mobs    Soft tissue mobilization  L pec, supraspinatus, pos delt TTP and soft tissue restriction in these areas    Passive ROM  L shoulder into flex, abd, IR/ER               PT Short Term Goals - 01/23/18 1724      PT SHORT TERM GOAL #1   Title  Patient to be independent with initial HEP.    Time  1    Period  Weeks    Status  New    Target Date  01/30/18        PT Long Term Goals - 01/23/18 1727      PT LONG TERM GOAL #1   Title  Patient to be indpendent with advanced HEP.    Time  2    Period  Weeks    Status  New      PT LONG TERM GOAL #2   Title  Patient to demonstrate L UE flexion, scaption, IR WFL in order promote functional use of UE at work.    Time  2    Period  Weeks    Status  New      PT LONG TERM GOAL #3   Title   Patient to perform R sidelying without pain in order to allow for pain-free sleep.    Time  2    Period  Weeks    Status  New    Target Date  02/06/18      PT LONG TERM GOAL #4   Title  Patient to demonstrate >=4+/5 L UE strength in order to perform work duties.    Time  2    Period  Weeks    Status  New    Target Date  02/06/18            Plan - 01/26/18 1036    PT Treatment/Interventions  ADLs/Self Care Home Management;Cryotherapy;Electrical Stimulation;Ultrasound;Moist Heat;Iontophoresis /ml Dexamethasone;Therapeutic activities;Therapeutic exercise;Patient/family education;Manual techniques;Vasopneumatic Device;Taping;Splinting;Passive range of motion;Dry needling    PT Next Visit Plan  Because of soreness throughout session today, will plan to try e-stim and Gameready at end of next session; PROM, mild scap strengthening    Consulted and Agree with Plan of Care  Patient       Patient will benefit from skilled therapeutic intervention in order to improve the following deficits and impairments:  Pain, Postural dysfunction, Decreased activity tolerance, Decreased range of motion, Decreased strength, Impaired UE functional use, Impaired flexibility  Visit Diagnosis: Acute pain of left shoulder  Stiffness of left shoulder, not elsewhere classified  Other symptoms and signs involving the musculoskeletal system  Muscle weakness (generalized)     Problem List There are no active problems to display for this patient.   Maryruth Bun 01/26/2018, 10:39 AM  [facility] [address]  [address] [place], [place], [address] Phone: [phone]   Fax:  [phone]  Name: Jimmy Barrett MRN: [id number] Date of Birth: November 25, 1985

## 2018-01-26 NOTE — Therapy (Signed)
[facility] Outpatient Rehabilitation [facility] [address]  [address] [place], [place], [address] Phone: [phone]   Fax:  [phone]  Physical Therapy Treatment  Patient Details  Name: Jimmy Barrett MRN: [id number] Date of Birth: 20-Aug-1986 Referring Provider: Jones Broom, MD   Encounter Date: 01/26/2018  PT End of Session - 01/26/18 1037    Visit Number  2    Number of Visits  5    Date for PT Re-Evaluation  02/06/18    Authorization Type  Workers comp- 1 eval, 4 visits    Authorization - Visit Number  2    Authorization - Number of Visits  5    PT Start Time  [phone]    PT Stop Time  1028    PT Time Calculation (min)  51 min    Activity Tolerance  Patient tolerated treatment well    Behavior During Therapy  [facility] for tasks assessed/performed       History reviewed. No pertinent past medical history.  Past Surgical History:  Procedure Laterality Date  . OTHER SURGICAL HISTORY     Shrapnel removed from leg in [place]    There were no vitals filed for this visit.  Subjective Assessment - 01/26/18 0939    Subjective  Reports he was on light duty yesterday did 4 hours sitting at a desk and his L shoulder was killing him. Excruciating pain; took his meds and it helped some. Wore sling for an hour and that helped but not for long. HEP has been going okay, ROM is getting better but pain is still there. Has MD appoint next Friday and wants to ask for MRI.    Diagnostic tests  x-ray: 01/06/18 L shoulder- "tiny osseous fragment in subacromial space, could be an old avulsion fx or calcific tendonitis."    Patient Stated Goals  get back to work and figure out what is wrong with my arm    Currently in Pain?  Yes    Pain Score  5     Pain Location  Shoulder    Pain Orientation  Left                       [facility] Adult PT Treatment/Exercise - 01/26/18 0001      Exercises   Exercises  Shoulder      Shoulder Exercises: Supine   Flexion  Left;10 reps;Limitations    Flexion Limitations  scaption with thumb up    ABduction  Left;10 reps      Shoulder Exercises: Prone   Retraction  Left;10 reps;Strengthening;Limitations    Retraction Limitations  prone row elbow at side; 2 sets; VC/TCs to promote scap retraction      Shoulder Exercises: Sidelying   External Rotation  Left;AROM;Limitations;10 reps    External Rotation Limitations  elbow in neutral with towel; cues to slow speed      Shoulder Exercises: Standing   Horizontal ABduction  Both;Strengthening;10 reps    Theraband Level (Shoulder Horizontal ABduction)  Level 1 (Yellow)    Horizontal ABduction Limitations  TC/VCs to promote scap retraction      Shoulder Exercises: Stretch   Other Shoulder Stretches  L pec stretch in doorframe; 2x20 sec      Modalities   Modalities  Vasopneumatic      Vasopneumatic   Number Minutes Vasopneumatic   10 minutes    Vasopnuematic Location   Shoulder    Vasopneumatic Pressure  Low  Vasopneumatic Temperature   Coldest temp      Manual Therapy   Manual Therapy  Soft tissue mobilization;Joint mobilization;Passive ROM    Joint Mobilization  Grade III L shoulder pos mobs    Soft tissue mobilization  L pec, supraspinatus, pos delt TTP and soft tissue restriction in these areas    Passive ROM  L shoulder into flex, abd, IR/ER               PT Short Term Goals - 01/23/18 1724      PT SHORT TERM GOAL #1   Title  Patient to be independent with initial HEP.    Time  1    Period  Weeks    Status  New    Target Date  01/30/18        PT Long Term Goals - 01/23/18 1727      PT LONG TERM GOAL #1   Title  Patient to be indpendent with advanced HEP.    Time  2    Period  Weeks    Status  New      PT LONG TERM GOAL #2   Title  Patient to demonstrate L UE flexion, scaption, IR WFL in order promote functional use of UE at work.    Time  2    Period  Weeks    Status  New      PT LONG TERM GOAL #3   Title   Patient to perform R sidelying without pain in order to allow for pain-free sleep.    Time  2    Period  Weeks    Status  New    Target Date  02/06/18      PT LONG TERM GOAL #4   Title  Patient to demonstrate >=4+/5 L UE strength in order to perform work duties.    Time  2    Period  Weeks    Status  New    Target Date  02/06/18            Plan - 01/26/18 1036    Clinical Impression Statement  Patient arrived to clinic with report that he has been consistent with HEP, says that ROM has increased but pain still there. Has MD appoint next Friday and wants to ask for MRI. Tolerated PROM and pos shoulder mobs to L shoulder in all planes- still limited in flexion/scaption and abduction mainly. Also tolerated STM to L pec, pos deltoid, supraspinatus- TTP and evident soft tissue restriction in these areas. Tolerated supine/SL AROM and scapular strengthening this visit. Received Gameready to L shoulder; reported L shoulder feeling much better at end of session. Because of soreness throughout session today, will plan to try e-stim and Gameready at end of next session.    PT Treatment/Interventions  ADLs/Self Care Home Management;Cryotherapy;Electrical Stimulation;Ultrasound;Moist Heat;Iontophoresis /ml Dexamethasone;Therapeutic activities;Therapeutic exercise;Patient/family education;Manual techniques;Vasopneumatic Device;Taping;Splinting;Passive range of motion;Dry needling    PT Next Visit Plan  Because of soreness throughout session today, will plan to try e-stim and Gameready at end of next session; PROM, mild scap strengthening    Consulted and Agree with Plan of Care  Patient       Patient will benefit from skilled therapeutic intervention in order to improve the following deficits and impairments:  Pain, Postural dysfunction, Decreased activity tolerance, Decreased range of motion, Decreased strength, Impaired UE functional use, Impaired flexibility  Visit Diagnosis: Acute pain of  left shoulder  Stiffness of left shoulder, not elsewhere classified  Other symptoms and signs involving the musculoskeletal system  Muscle weakness (generalized)     Problem List There are no active problems to display for this patient.   Anette Guarneri, PT, DPT 01/26/18 10:41 AM   [facility] [address]  [address] [place], [place], [address] Phone: [phone]   Fax:  [phone]  Name: Jimmy Barrett MRN: [id number] Date of Birth: 1986-06-10

## 2018-01-30 ENCOUNTER — Ambulatory Visit: Payer: PRIVATE HEALTH INSURANCE

## 2018-01-30 DIAGNOSIS — R29898 Other symptoms and signs involving the musculoskeletal system: Secondary | ICD-10-CM

## 2018-01-30 DIAGNOSIS — M25512 Pain in left shoulder: Secondary | ICD-10-CM | POA: Diagnosis not present

## 2018-01-30 DIAGNOSIS — M25612 Stiffness of left shoulder, not elsewhere classified: Secondary | ICD-10-CM

## 2018-01-30 DIAGNOSIS — M6281 Muscle weakness (generalized): Secondary | ICD-10-CM

## 2018-01-30 NOTE — Therapy (Signed)
[facility] Outpatient Rehabilitation [facility] [address]  [address] [place], [place], [address] Phone: [phone]   Fax:  [phone]  Physical Therapy Treatment  Patient Details  Name: Jimmy Barrett MRN: [id number] Date of Birth: 12/26/1985 Referring Provider: Jones Broom, MD   Encounter Date: 01/30/2018  PT End of Session - 01/30/18 1420    Visit Number  3    Number of Visits  5    Date for PT Re-Evaluation  02/06/18    Authorization Type  Workers comp- 1 eval, 4 visits    Authorization - Visit Number  3    Authorization - Number of Visits  5    PT Start Time  1416 pt. arrived late to session     PT Stop Time  1500    PT Time Calculation (min)  44 min    Activity Tolerance  Patient tolerated treatment well    Behavior During Therapy  [facility] for tasks assessed/performed       No past medical history on file.  Past Surgical History:  Procedure Laterality Date  . OTHER SURGICAL HISTORY     Shrapnel removed from leg in [place]    There were no vitals filed for this visit.  Subjective Assessment - 01/30/18 1418    Subjective  Pt. reporting some improvement in pain levels following last visit.       Diagnostic tests  x-ray: 01/06/18 L shoulder- "tiny osseous fragment in subacromial space, could be an old avulsion fx or calcific tendonitis."    Patient Stated Goals  get back to work and figure out what is wrong with my arm    Currently in Pain?  Yes    Pain Score  5     Pain Location  Shoulder    Pain Orientation  Left    Pain Descriptors / Indicators  Aching;Throbbing    Pain Type  Acute pain    Pain Radiating Towards  None today    Pain Onset  1 to 4 weeks ago    Aggravating Factors   Sleeping on L, reaching overhead, lifting     Pain Relieving Factors  medication, ice    Multiple Pain Sites  No                       [facility] Adult PT Treatment/Exercise - 01/30/18 1438      Shoulder Exercises: Standing   Extension   Both;15 reps;Theraband    Theraband Level (Shoulder Extension)  Level 1 (Yellow)    Row  Both;15 reps;Theraband    Theraband Level (Shoulder Row)  Level 2 (Red)    Row Limitations  cues required to avoid hyper extension at shoulders and improved tolerance from report of HEP       Modalities   Modalities  Primary school teacher Stimulation Location  L shoulder complex     Electrical Stimulation Action  IFC    Electrical Stimulation Parameters  80-150 Hz, intensity to pt. tolerance, 10'    Electrical Stimulation Goals  Pain      Vasopneumatic   Number Minutes Vasopneumatic   10 minutes    Vasopnuematic Location   Shoulder    Vasopneumatic Pressure  Low    Vasopneumatic Temperature   Coldest temp      Manual Therapy   Manual Therapy  Soft tissue mobilization;Joint mobilization;Passive ROM    Joint Mobilization  Grade III  L shoulder pos mobs    Soft tissue mobilization  posterior deltoid, posterior RTC               PT Short Term Goals - 01/30/18 1436      PT SHORT TERM GOAL #1   Title  Patient to be independent with initial HEP.    Time  1    Period  Weeks    Status  Achieved        PT Long Term Goals - 01/30/18 1439      PT LONG TERM GOAL #1   Title  Patient to be indpendent with advanced HEP.    Time  2    Period  Weeks    Status  On-going      PT LONG TERM GOAL #2   Title  Patient to demonstrate L UE flexion, scaption, IR WFL in order promote functional use of UE at work.    Time  2    Period  Weeks    Status  On-going      PT LONG TERM GOAL #3   Title  Patient to perform R sidelying without pain in order to allow for pain-free sleep.    Time  2    Period  Weeks    Status  On-going      PT LONG TERM GOAL #4   Title  Patient to demonstrate >=4+/5 L UE strength in order to perform work duties.    Time  2    Period  Weeks    Status  On-going            Plan - 01/30/18 1421    Clinical Impression  Statement  Pt. noting some improvement in pain levels since last visit.  Significant pain increase with elevation activities today at L shoulder.  Pt. ttp with TP in posterior shoulder musculature, which was addressed with manual therapy.  Pt. arrived late to session today thus treatment time limited.  Did correct technique with HEP band row to reduce hyperextension and pt. with improved tolerance for this activity following this.  Ended treatment with E-stim/ice combo to promote reduction in post-exercise swelling and pain.      PT Treatment/Interventions  ADLs/Self Care Home Management;Cryotherapy;Electrical Stimulation;Ultrasound;Moist Heat;Iontophoresis /ml Dexamethasone;Therapeutic activities;Therapeutic exercise;Patient/family education;Manual techniques;Vasopneumatic Device;Taping;Splinting;Passive range of motion;Dry needling    PT Next Visit Plan  PROM, mild scap strengthening    Consulted and Agree with Plan of Care  Patient       Patient will benefit from skilled therapeutic intervention in order to improve the following deficits and impairments:  Pain, Postural dysfunction, Decreased activity tolerance, Decreased range of motion, Decreased strength, Impaired UE functional use, Impaired flexibility  Visit Diagnosis: Acute pain of left shoulder  Stiffness of left shoulder, not elsewhere classified  Other symptoms and signs involving the musculoskeletal system  Muscle weakness (generalized)     Problem List There are no active problems to display for this patient.  Kermit Balo, PTA 01/30/18 6:36 PM  [facility] Outpatient Rehabilitation [facility] [address]  [address] [place], [place], [address] Phone: [phone]   Fax:  [phone]  Name: Jimmy Barrett MRN: [id number] Date of Birth: Apr 06, 1986

## 2018-02-02 ENCOUNTER — Ambulatory Visit: Payer: PRIVATE HEALTH INSURANCE | Admitting: Physical Therapy

## 2018-02-02 ENCOUNTER — Other Ambulatory Visit (HOSPITAL_COMMUNITY): Payer: Self-pay | Admitting: Orthopedic Surgery

## 2018-02-02 ENCOUNTER — Encounter: Payer: Self-pay | Admitting: Physical Therapy

## 2018-02-02 DIAGNOSIS — M25512 Pain in left shoulder: Secondary | ICD-10-CM

## 2018-02-02 DIAGNOSIS — M67912 Unspecified disorder of synovium and tendon, left shoulder: Secondary | ICD-10-CM

## 2018-02-02 DIAGNOSIS — M25612 Stiffness of left shoulder, not elsewhere classified: Secondary | ICD-10-CM

## 2018-02-02 DIAGNOSIS — R29898 Other symptoms and signs involving the musculoskeletal system: Secondary | ICD-10-CM

## 2018-02-02 DIAGNOSIS — M6281 Muscle weakness (generalized): Secondary | ICD-10-CM

## 2018-02-02 NOTE — Patient Instructions (Signed)
Access Code: [email or web address]  URL: [email or web address]  Date: 02/02/2018  Prepared by: Georgina Peer   Exercises  Standing Shoulder Row with Anchored Resistance - 10 reps - 2 sets - 2x daily - 7x weekly

## 2018-02-02 NOTE — Therapy (Signed)
[facility] Outpatient Rehabilitation [facility] [address]  [address] [place], [place], [address] Phone: [phone]   Fax:  [phone]  Physical Therapy Treatment  Patient Details  Name: Jimmy Barrett MRN: [id number] Date of Birth: 1985/10/24 Referring Provider: Jones Broom, MD   Encounter Date: 02/02/2018  PT End of Session - 02/02/18 1150    Visit Number  4    Number of Visits  5    Date for PT Re-Evaluation  02/06/18    Authorization Type  Workers comp- 1 eval, 4 visits    Authorization - Visit Number  4    Authorization - Number of Visits  5    PT Start Time  1113    PT Stop Time  1200    PT Time Calculation (min)  47 min    Activity Tolerance  Patient tolerated treatment well;Patient limited by pain    Behavior During Therapy  [facility] for tasks assessed/performed       History reviewed. No pertinent past medical history.  Past Surgical History:  Procedure Laterality Date  . OTHER SURGICAL HISTORY     Shrapnel removed from leg in [place]    There were no vitals filed for this visit.  Subjective Assessment - 02/02/18 1114    Subjective  Patient reports he woke up yesterday and could not move it. Had not used it in any strenuous way the day before. Today able to move it more, but still limited than before. Feels like a a 100lb weight is on my arm.     Diagnostic tests  x-ray: 01/06/18 L shoulder- "tiny osseous fragment in subacromial space, could be an old avulsion fx or calcific tendonitis."    Patient Stated Goals  get back to work and figure out what is wrong with my arm    Currently in Pain?  Yes    Pain Score  6     Pain Location  Shoulder    Pain Orientation  Left    Pain Descriptors / Indicators  Aching;Throbbing         [facility] PT Assessment - 02/02/18 0001      AROM   Left Shoulder Flexion  114 Degrees    Left Shoulder ABduction  112 Degrees    Left Shoulder Internal Rotation  40 Degrees    Left Shoulder External Rotation   45 Degrees      PROM   Left Shoulder Flexion  120 Degrees    Left Shoulder ABduction  88 Degrees    Left Shoulder Internal Rotation  48 Degrees    Left Shoulder External Rotation  52 Degrees      Strength   Left Shoulder Flexion  4/5    Left Shoulder ABduction  4/5    Left Shoulder Internal Rotation  4+/5    Left Shoulder External Rotation  4/5                   [facility] Adult PT Treatment/Exercise - 02/02/18 0001      Shoulder Exercises: Supine   Flexion  Left;Limitations able to perform ~7 reps, then limited by pain; discontinued    Flexion Limitations  scaption with thumb up      Shoulder Exercises: Standing   Extension  Both;15 reps;Theraband    Theraband Level (Shoulder Extension)  Level 2 (Red) cues to avoid hip hiking    Row  Both;15 reps;Theraband    Theraband Level (Shoulder Row)  Level 2 (Red)  Row Limitations  Cues to maintain elbow flexion and keep elbows by sides    Other Standing Exercises  L Doorway pec stretch; 30 sec 90 degrees; 30 sec 120 degrees      Vasopneumatic   Number Minutes Vasopneumatic   15 minutes    Vasopnuematic Location   Shoulder L    Vasopneumatic Pressure  Low    Vasopneumatic Temperature   Coldest temp      Manual Therapy   Manual Therapy  Joint mobilization;Soft tissue mobilization;Passive ROM    Joint Mobilization  Grade III L shoulder pos/inf mobs    Soft tissue mobilization  pos delt, infraspinatus, pec    Passive ROM  L shoulder flexion, abd, IR, ER             PT Education - 02/02/18 1215    Education Details  Sitting posture during desk work, incorporating scap retractions    Person(s) Educated  Patient    Methods  Explanation;Demonstration    Comprehension  Verbalized understanding       PT Short Term Goals - 02/02/18 1116      PT SHORT TERM GOAL #1   Title  Patient to be independent with initial HEP.    Time  1    Period  Weeks    Status  Achieved        PT Long Term Goals - 02/02/18 1151       PT LONG TERM GOAL #1   Title  Patient to be indpendent with advanced HEP.    Time  2    Period  Weeks    Status  On-going      PT LONG TERM GOAL #2   Title  Patient to demonstrate L UE flexion, scaption, IR WFL in order promote functional use of UE at work.    Time  2    Period  Weeks    Status  On-going patient still limited in L shoulder flexion, abduction, IR, ER; ER AROM & PROM has improved but still limited      PT LONG TERM GOAL #3   Title  Patient to perform L sidelying without pain in order to allow for pain-free sleep.    Time  2    Period  Weeks    Status  On-going patient unable to tolerate L sideling at this time      PT LONG TERM GOAL #4   Title  Patient to demonstrate >=4+/5 L UE strength in order to perform work duties.    Time  2    Period  Weeks    Status  On-going MMT unchanged at this time            Plan - 02/02/18 1214    Clinical Impression Statement  Patient arrived late to session- reports he thought his MD appointment was before PT session. Reports that he had an exacerbation of L shoulder pain on Wednesday. Thinks that he may have slept on it wrong. Since then, motion has been limited and increased level of pain. Patient reporting he feels like he is getting worse and would like to get an MRI. Is still on light duty at work- even sitting desk work is giving him L shoulder pain. At this time, patient has reported good compliance with initial HEP and demonstrates increased L shoulder ER AROM & PROM. Would benefit from continued skilled PT services to address remaining ROM, strength, and functional goals. Patient tolerated L shoulder PROM in all planes,  inferior/posterior shoulder mobs, and STM to pos delt, infraspinatus, pec. Noted significant soft tissue restriction in these areas. Instructed patient on supine scaption- patient completed ~7 reps; limited by pain and told to discontinue. Performed standing resisted row and extension- requiring VC/TCs to  correct form. Patient reporting he is struggling with row exercise at home- gave patient additional HEP video via text to supplement HEP. Received Gameready to L shoulder at end of session; normal integumentary response noted.     PT Treatment/Interventions  ADLs/Self Care Home Management;Cryotherapy;Electrical Stimulation;Ultrasound;Moist Heat;Iontophoresis /ml Dexamethasone;Therapeutic activities;Therapeutic exercise;Patient/family education;Manual techniques;Vasopneumatic Device;Taping;Splinting;Passive range of motion;Dry needling    PT Next Visit Plan  PROM, mild scap strengthening    Consulted and Agree with Plan of Care  Patient       Patient will benefit from skilled therapeutic intervention in order to improve the following deficits and impairments:  Pain, Postural dysfunction, Decreased activity tolerance, Decreased range of motion, Decreased strength, Impaired UE functional use, Impaired flexibility  Visit Diagnosis: Acute pain of left shoulder  Stiffness of left shoulder, not elsewhere classified  Other symptoms and signs involving the musculoskeletal system  Muscle weakness (generalized)     Problem List There are no active problems to display for this patient.   Anette Guarneri, PT, DPT 02/02/18 12:20 PM   [facility] Outpatient Rehabilitation [facility] [address]  [address] [place], [place], [address] Phone: [phone]   Fax:  [phone]  Name: Jimmy Barrett MRN: [id number] Date of Birth: 07/19/1986

## 2018-02-03 ENCOUNTER — Ambulatory Visit (HOSPITAL_COMMUNITY): Admission: RE | Admit: 2018-02-03 | Payer: PRIVATE HEALTH INSURANCE | Source: Ambulatory Visit

## 2018-02-03 ENCOUNTER — Ambulatory Visit (HOSPITAL_COMMUNITY)
Admission: RE | Admit: 2018-02-03 | Discharge: 2018-02-03 | Disposition: A | Discharge status: Home / Self Care | Payer: PRIVATE HEALTH INSURANCE | Source: Ambulatory Visit | Attending: Orthopedic Surgery | Admitting: Orthopedic Surgery

## 2018-02-03 DIAGNOSIS — R600 Localized edema: Secondary | ICD-10-CM | POA: Diagnosis not present

## 2018-02-03 DIAGNOSIS — M67912 Unspecified disorder of synovium and tendon, left shoulder: Secondary | ICD-10-CM | POA: Diagnosis present

## 2018-02-06 ENCOUNTER — Encounter: Payer: Self-pay | Admitting: Physical Therapy

## 2018-02-06 ENCOUNTER — Ambulatory Visit: Payer: PRIVATE HEALTH INSURANCE | Admitting: Physical Therapy

## 2018-02-06 DIAGNOSIS — R29898 Other symptoms and signs involving the musculoskeletal system: Secondary | ICD-10-CM

## 2018-02-06 DIAGNOSIS — M25512 Pain in left shoulder: Secondary | ICD-10-CM | POA: Diagnosis not present

## 2018-02-06 DIAGNOSIS — M25612 Stiffness of left shoulder, not elsewhere classified: Secondary | ICD-10-CM

## 2018-02-06 DIAGNOSIS — M6281 Muscle weakness (generalized): Secondary | ICD-10-CM

## 2018-02-06 NOTE — Therapy (Signed)
[facility] [address]  [address] [place], [place], [address] Phone: [phone]   Fax:  [phone]  Physical Therapy Treatment  Patient Details  Name: Jimmy Barrett MRN: [id number] Date of Birth: May 20, 1986 Referring Provider: Jones Broom, MD   Encounter Date: 02/06/2018  PT End of Session - 02/06/18 1507    Visit Number  5    Number of Visits  5    Date for PT Re-Evaluation  02/06/18    Authorization Type  Workers comp- 1 eval, 4 visits    Authorization - Visit Number  5    Authorization - Number of Visits  5    PT Start Time  1315    PT Stop Time  1405    PT Time Calculation (min)  50 min    Activity Tolerance  Patient tolerated treatment well;Patient limited by pain    Behavior During Therapy  [facility] for tasks assessed/performed       History reviewed. No pertinent past medical history.  Past Surgical History:  Procedure Laterality Date  . OTHER SURGICAL HISTORY     Shrapnel removed from leg in [place]    There were no vitals filed for this visit.  Subjective Assessment - 02/06/18 1317    Subjective  Patient reports he saw MD on Friday and had MRI saturday. Has not discussed results with MD yet. Reports his flexibility in L shoulder has improved but has noticed that he has trouble moving his arm the next morning after PT sessions. However, reports he is overall able to move his arm functionall and through a greater range d/t PT. Would like to continue with PT. Sees MD tomorrow AM.    Diagnostic tests  x-ray: 01/06/18 L shoulder- "tiny osseous fragment in subacromial space, could be an old avulsion fx or calcific tendonitis."    Patient Stated Goals  get back to work and figure out what is wrong with my arm    Currently in Pain?  Yes    Pain Score  4     Pain Location  Shoulder    Pain Orientation  Left    Pain Descriptors / Indicators  Aching;Throbbing    Pain Type  Acute pain                        [facility] Adult PT Treatment/Exercise - 02/06/18 0001      Shoulder Exercises: Supine   External Rotation  AAROM;Both;10 reps;Limitations    External Rotation Limitations  supine with wand with elbows at side    Flexion  Both;10 reps;Limitations;AAROM    Flexion Limitations  supine wand flexion    ABduction  AAROM;Left;10 reps;Limitations    ABduction Limitations  Supine wand ABD      Shoulder Exercises: Isometric Strengthening   Flexion  5X10";Limitations    Flexion Limitations  10% effort    External Rotation  5X10";Limitations    External Rotation Limitations  10% effort    Internal Rotation  5X10";Limitations    Internal Rotation Limitations  10% effort    ABduction  5X10";Limitations    ABduction Limitations  10% effort      Electrical Stimulation   Electrical Stimulation Location  L shoulder complex     Electrical Stimulation Action  IFC    Electrical Stimulation Parameters  output 10, 10 min    Electrical Stimulation Goals  Pain      Vasopneumatic   Number Minutes Vasopneumatic  15 minutes    Vasopnuematic Location   Shoulder L    Vasopneumatic Pressure  Low    Vasopneumatic Temperature   Coldest temp      Manual Therapy   Manual Therapy  Passive ROM;Soft tissue mobilization    Soft tissue mobilization  L pec and delt TTP in pec and middle delt    Passive ROM  L shoulder flexion, abd, IR, ER               PT Short Term Goals - 02/02/18 1116      PT SHORT TERM GOAL #1   Title  Patient to be independent with initial HEP.    Time  1    Period  Weeks    Status  Achieved        PT Long Term Goals - 02/02/18 1151      PT LONG TERM GOAL #1   Title  Patient to be indpendent with advanced HEP.    Time  2    Period  Weeks    Status  On-going      PT LONG TERM GOAL #2   Title  Patient to demonstrate L UE flexion, scaption, IR WFL in order promote functional use of UE at work.    Time  2    Period  Weeks    Status   On-going patient still limited in L shoulder flexion, abduction, IR, ER; ER AROM & PROM has improved but still limited      PT LONG TERM GOAL #3   Title  Patient to perform L sidelying without pain in order to allow for pain-free sleep.    Time  2    Period  Weeks    Status  On-going patient unable to tolerate L sideling at this time      PT LONG TERM GOAL #4   Title  Patient to demonstrate >=4+/5 L UE strength in order to perform work duties.    Time  2    Period  Weeks    Status  On-going MMT unchanged at this time            Plan - 02/06/18 1508    Clinical Impression Statement  Patient arrived to appointment with report that he saw his MD on Friday, had MRI on Saturday and has follow up appointment with MD tomorrow. Reports he believes he has benefited from PT thus far and would like to continue. Objective measurements taken last treatment session. Reports he is able to perform more functional activities like overhead and lateral reaching than he was before. Due to patient's concern about MRI results, focused on gentle PROM/AROM and isometrics this date. Tolerated PROM to L shoulder with good movement. Performed AAROM with wand into flex, abd, IR/ER; patient with report of shoulder popping on return from flexion. Received Gameready and e-stim to L shoulder at end of session; normal integumentary response noted. Patient will continue to benefit from skilled PT services to progress towards goals and return to work.     PT Treatment/Interventions  ADLs/Self Care Home Management;Cryotherapy;Electrical Stimulation;Ultrasound;Moist Heat;Iontophoresis /ml Dexamethasone;Therapeutic activities;Therapeutic exercise;Patient/family education;Manual techniques;Vasopneumatic Device;Taping;Splinting;Passive range of motion;Dry needling    PT Next Visit Plan  PROM, AROM, mild scap strengthening    Consulted and Agree with Plan of Care  Patient       Patient will benefit from skilled therapeutic  intervention in order to improve the following deficits and impairments:  Pain, Postural dysfunction, Decreased activity tolerance, Decreased range  of motion, Decreased strength, Impaired UE functional use, Impaired flexibility  Visit Diagnosis: Acute pain of left shoulder  Stiffness of left shoulder, not elsewhere classified  Other symptoms and signs involving the musculoskeletal system  Muscle weakness (generalized)     Problem List There are no active problems to display for this patient.  Anette Guarneri, PT, DPT 02/06/18 3:11 PM   [facility] [address]  [address] [place], [place], [address] Phone: [phone]   Fax:  [phone]  Name: Jimmy Barrett MRN: [id number] Date of Birth: 03-Jun-1986

## 2018-02-13 ENCOUNTER — Ambulatory Visit: Payer: PRIVATE HEALTH INSURANCE | Attending: Orthopedic Surgery | Admitting: Physical Therapy

## 2018-02-13 ENCOUNTER — Encounter: Payer: Self-pay | Admitting: Physical Therapy

## 2018-02-13 DIAGNOSIS — M25512 Pain in left shoulder: Secondary | ICD-10-CM | POA: Diagnosis present

## 2018-02-13 DIAGNOSIS — M25612 Stiffness of left shoulder, not elsewhere classified: Secondary | ICD-10-CM | POA: Diagnosis present

## 2018-02-13 DIAGNOSIS — M6281 Muscle weakness (generalized): Secondary | ICD-10-CM | POA: Insufficient documentation

## 2018-02-13 DIAGNOSIS — R29898 Other symptoms and signs involving the musculoskeletal system: Secondary | ICD-10-CM | POA: Insufficient documentation

## 2018-02-13 NOTE — Patient Instructions (Signed)
Added IR/ER stretch with towel to HEP- patient already has printout of this exercise; reported understanding of not pushing into pain.

## 2018-02-13 NOTE — Therapy (Signed)
[facility] [address]  [address] [place], [place], [address] Phone: [phone]   Fax:  [phone]  Physical Therapy Treatment  Patient Details  Name: Jimmy Barrett MRN: [id number] Date of Birth: 04-26-1986 Referring Provider: Jones Broom, MD   Encounter Date: 02/13/2018  PT End of Session - 02/13/18 1812    Visit Number  6    Number of Visits  13    Date for PT Re-Evaluation  03/13/18    Authorization Type  Workers comp- 1 eval, 12 visits    Authorization - Visit Number  1    Authorization - Number of Visits  8    PT Start Time  1619    PT Stop Time  1657    PT Time Calculation (min)  38 min    Activity Tolerance  Patient tolerated treatment well    Behavior During Therapy  [facility] for tasks assessed/performed       History reviewed. No pertinent past medical history.  Past Surgical History:  Procedure Laterality Date  . OTHER SURGICAL HISTORY     Shrapnel removed from leg in [place]    There were no vitals filed for this visit.  Subjective Assessment - 02/13/18 1621    Subjective  Patient reports he had MD appointment to review MRI- says that MD told him it is severely strained and possibly fractured but not surgery indicated. Was given a steroid injection and has seen big improvement since then. Is still on light duty at work.     Diagnostic tests  x-ray: 01/06/18 L shoulder- "tiny osseous fragment in subacromial space, could be an old avulsion fx or calcific tendonitis."    Patient Stated Goals  get back to work and figure out what is wrong with my arm    Currently in Pain?  Yes    Pain Score  3     Pain Location  Shoulder    Pain Orientation  Left    Pain Descriptors / Indicators  Aching;Throbbing         [facility] PT Assessment - 02/13/18 0001      AROM   Left Shoulder Flexion  155 Degrees scaption 136    Left Shoulder ABduction  130 Degrees    Left Shoulder Internal Rotation  65 Degrees    Left Shoulder External Rotation  68 Degrees      Strength   Left Shoulder Flexion  4+/5    Left Shoulder ABduction  4+/5    Left Shoulder Internal Rotation  4+/5    Left Shoulder External Rotation  4/5                   [facility] Adult PT Treatment/Exercise - 02/13/18 0001      Shoulder Exercises: Prone   Other Prone Exercises  L UE midrow 15x 2#, row 15x 2#    Other Prone Exercises  I, T, Y leaning over green pball; 10x each cues to squeeze scapulae      Shoulder Exercises: Sidelying   External Rotation  Left;AROM;Limitations;10 reps;Weights    External Rotation Weight (lbs)  2x10 1#, unable to tolerate 2#    External Rotation Limitations  elbow in neutral with dowel      Shoulder Exercises: Standing   External Rotation  Left;Strengthening;10 reps;Theraband;Limitations    Theraband Level (Shoulder External Rotation)  Level 2 (Red)    External Rotation Limitations  VC/TCs to keep elbow at neutral    Internal  Rotation  Strengthening;Left;10 reps;Theraband;Limitations    Theraband Level (Shoulder Internal Rotation)  Level 2 (Red)    Internal Rotation Limitations  -- Vcs for positioning    Other Standing Exercises  L UE rolling ball on wall with scap retraction; 30x CC/CCW    Other Standing Exercises  B UE maintaining bouncing ball over head; 2x30 sec      Shoulder Exercises: Stretch   Other Shoulder Stretches  L pec stretch in doorframe; 2x20 sec      Manual Therapy   Manual Therapy  Joint mobilization;Passive ROM    Joint Mobilization  grade III/IV inferior L shoulder mobs    Passive ROM  L shoulder abduction, IR, ER               PT Short Term Goals - 02/02/18 1116      PT SHORT TERM GOAL #1   Title  Patient to be independent with initial HEP.    Time  1    Period  Weeks    Status  Achieved        PT Long Term Goals - 02/13/18 1624      PT LONG TERM GOAL #1   Title  Patient to be indpendent with advanced HEP.    Time  2    Period  Weeks     Status  Achieved      PT LONG TERM GOAL #2   Title  Patient to demonstrate L UE flexion, scaption, IR WFL in order promote functional use of UE at work.    Time  2    Period  Weeks    Status  On-going improving, see objective measures    Target Date  03/13/18      PT LONG TERM GOAL #3   Title  Patient to perform L sidelying without pain in order to allow for pain-free sleep.    Time  2    Period  Weeks    Status  Achieved patient unable to tolerate L sideling at this time      PT LONG TERM GOAL #4   Title  Patient to demonstrate >=4+/5 L UE strength in order to perform work duties.    Time  2    Period  Weeks    Status  On-going 4/5 in L ER    Target Date  03/13/18      PT LONG TERM GOAL #5   Title  Patient to return back to work full time without pain limited.    Time  4    Period  Weeks    Status  New    Target Date  03/13/18            Plan - 02/13/18 1814    Clinical Impression Statement  Patient arrived at session with report that his L shoulder feels much improved since last session- saw MD who gave him a steroid injection. Better able to perform L shoulder AROM and reaching overhead at this time. Measured goals today- patient showing improvement in L shoulder AROM and strength, and is not able to tolerate L sidelying at night. Today tolerated L shoulder PROM into abduction, IR, ER as well as L shoulder mobs in inferior direction to encourage ABD motion. Able to perform progressive RC strengthening this date with weight and banded resistance to tolerance. Patient progressing well and will benefit from continued skilled PT services 2x/week for 4 weeks to address remaining goals.     PT Treatment/Interventions  ADLs/Self Care Home Management;Cryotherapy;Electrical Stimulation;Ultrasound;Moist Heat;Iontophoresis 4mg /ml Dexamethasone;Therapeutic activities;Therapeutic exercise;Patient/family education;Manual techniques;Vasopneumatic Device;Taping;Splinting;Passive range of  motion;Dry needling    PT Next Visit Plan  resisted scapular stability and RTC strengthening, UBE    Consulted and Agree with Plan of Care  Patient       Patient will benefit from skilled therapeutic intervention in order to improve the following deficits and impairments:  Pain, Postural dysfunction, Decreased activity tolerance, Decreased range of motion, Decreased strength, Impaired UE functional use, Impaired flexibility  Visit Diagnosis: Acute pain of left shoulder  Stiffness of left shoulder, not elsewhere classified  Other symptoms and signs involving the musculoskeletal system  Muscle weakness (generalized)     Problem List There are no active problems to display for this patient.   Anette Guarneri, PT, DPT 02/13/18 6:20 PM   [facility] [address]  [address] [place], [place], [address] Phone: [phone]   Fax:  [phone]  Name: Jimmy Barrett MRN: [id number] Date of Birth: 02-25-1986

## 2018-02-16 ENCOUNTER — Ambulatory Visit: Payer: PRIVATE HEALTH INSURANCE | Admitting: Physical Therapy

## 2018-02-16 ENCOUNTER — Encounter: Payer: Self-pay | Admitting: Physical Therapy

## 2018-02-16 DIAGNOSIS — M25512 Pain in left shoulder: Secondary | ICD-10-CM

## 2018-02-16 DIAGNOSIS — R29898 Other symptoms and signs involving the musculoskeletal system: Secondary | ICD-10-CM

## 2018-02-16 DIAGNOSIS — M25612 Stiffness of left shoulder, not elsewhere classified: Secondary | ICD-10-CM

## 2018-02-16 DIAGNOSIS — M6281 Muscle weakness (generalized): Secondary | ICD-10-CM

## 2018-02-16 NOTE — Therapy (Signed)
[facility] [address]  [address] [place], [place], [address] Phone: [phone]   Fax:  [phone]  Physical Therapy Treatment  Patient Details  Name: Jimmy Barrett MRN: [id number] Date of Birth: 08-04-1986 Referring Provider: Jones Broom, MD   Encounter Date: 02/16/2018  PT End of Session - 02/16/18 0841    Visit Number  7    Number of Visits  13    Date for PT Re-Evaluation  03/13/18    Authorization Type  Workers comp- 1 eval, 12 visits    Authorization - Visit Number  2    Authorization - Number of Visits  8    PT Start Time  0800    PT Stop Time  0846    PT Time Calculation (min)  46 min    Activity Tolerance  Patient tolerated treatment well    Behavior During Therapy  [facility] for tasks assessed/performed       History reviewed. No pertinent past medical history.  Past Surgical History:  Procedure Laterality Date  . OTHER SURGICAL HISTORY     Shrapnel removed from leg in [place]    There were no vitals filed for this visit.  Subjective Assessment - 02/16/18 0802    Subjective  Reports L shoulder was bothering last night. Denies increase activity level that could have caused flare up.     Diagnostic tests  x-ray: 01/06/18 L shoulder- "tiny osseous fragment in subacromial space, could be an old avulsion fx or calcific tendonitis."    Patient Stated Goals  get back to work and figure out what is wrong with my arm    Currently in Pain?  Yes    Pain Score  4     Pain Location  Shoulder    Pain Orientation  Left    Pain Descriptors / Indicators  Aching                       [facility] Adult PT Treatment/Exercise - 02/16/18 0001      Shoulder Exercises: Seated   Flexion  AAROM;Both;10 reps 3 sec hold; B UEs on pball rolling into flexion    Abduction  AAROM;Left;10 reps 3 sec hold; rolling L UE on pball into abduction      Shoulder Exercises: Prone   Other Prone Exercises  L UE midrow 15x  2#      Shoulder Exercises: Sidelying   External Rotation  Left;Strengthening;10 reps;Weights    External Rotation Weight (lbs)  10x, 2#    External Rotation Limitations  elbow in neutral with dowel      Shoulder Exercises: Standing   External Rotation  Left;Strengthening;10 reps;Theraband;Limitations    Theraband Level (Shoulder External Rotation)  Level 2 (Red)    External Rotation Limitations  VC/TCs to keep elbow at neutral    Internal Rotation  Strengthening;Left;10 reps;Theraband;Limitations    Theraband Level (Shoulder Internal Rotation)  Level 2 (Red)    Internal Rotation Limitations  VCs to keep elbow at side      Shoulder Exercises: ROM/Strengthening   UBE (Upper Arm Bike)  L1 x  3/3      Vasopneumatic   Number Minutes Vasopneumatic   15 minutes    Vasopnuematic Location   Shoulder L    Vasopneumatic Pressure  Low    Vasopneumatic Temperature   Coldest temp      Manual Therapy   Manual Therapy  Joint mobilization;Passive ROM  Joint Mobilization  grade III/IV inferior and posterior joint mobs L    Passive ROM  L shoulder abd and flex to tolerance             PT Education - 02/16/18 0838    Education provided  Yes    Education Details  addition to HEP, told to discontinue easy exercises, addition of green TB for row and extension    Person(s) Educated  Patient    Methods  Explanation;Demonstration;Tactile cues;Verbal cues;Handout    Comprehension  Verbalized understanding;Returned demonstration       PT Short Term Goals - 02/02/18 1116      PT SHORT TERM GOAL #1   Title  Patient to be independent with initial HEP.    Time  1    Period  Weeks    Status  Achieved        PT Long Term Goals - 02/13/18 1624      PT LONG TERM GOAL #1   Title  Patient to be indpendent with advanced HEP.    Time  2    Period  Weeks    Status  Achieved      PT LONG TERM GOAL #2   Title  Patient to demonstrate L UE flexion, scaption, IR WFL in order promote functional  use of UE at work.    Time  2    Period  Weeks    Status  On-going improving, see objective measures    Target Date  03/13/18      PT LONG TERM GOAL #3   Title  Patient to perform L sidelying without pain in order to allow for pain-free sleep.    Time  2    Period  Weeks    Status  Achieved patient unable to tolerate L sideling at this time      PT LONG TERM GOAL #4   Title  Patient to demonstrate >=4+/5 L UE strength in order to perform work duties.    Time  2    Period  Weeks    Status  On-going 4/5 in L ER    Target Date  03/13/18      PT LONG TERM GOAL #5   Title  Patient to return back to work full time without pain limited.    Time  4    Period  Weeks    Status  New    Target Date  03/13/18            Plan - 02/16/18 0956    Clinical Impression Statement  Patient arrived to appointment with report that he had increased pain in L shoulder last night, unable to recall what could have caused this pain. Tolerated UBE for warmup this session without report of pain. Tolerated L shoulder PROM into flexion and abduction as well as grade III/IV joint mobs inferior and posterior directions. Able to progress RTC and scapular strengthening this date without provocation of pain. VC/TCs required to correct form. Also tolerated L shoulder AAROM with ball to tolerance. Updated HEP with additional RTC strengthening and ROM exercises and advised patient to discontinue isometrics as these have become too easy. Gave patient green TB to progress rows and extension at home. Patient reported understanding. Received Gameready to L shoulder at end of session- relief of symptoms noted.     PT Treatment/Interventions  ADLs/Self Care Home Management;Cryotherapy;Electrical Stimulation;Ultrasound;Moist Heat;Iontophoresis 4mg /ml Dexamethasone;Therapeutic activities;Therapeutic exercise;Patient/family education;Manual techniques;Vasopneumatic Device;Taping;Splinting;Passive range of motion;Dry needling  PT Next Visit Plan  progress scapular and RTC strenghtening to tolerance    Consulted and Agree with Plan of Care  Patient       Patient will benefit from skilled therapeutic intervention in order to improve the following deficits and impairments:  Pain, Postural dysfunction, Decreased activity tolerance, Decreased range of motion, Decreased strength, Impaired UE functional use, Impaired flexibility  Visit Diagnosis: Acute pain of left shoulder  Stiffness of left shoulder, not elsewhere classified  Other symptoms and signs involving the musculoskeletal system  Muscle weakness (generalized)     Problem List There are no active problems to display for this patient.   Anette Guarneri, PT, DPT 02/16/18 10:02 AM    [facility] [address]  [address] [place], [place], [address] Phone: [phone]   Fax:  [phone]  Name: Jimmy Barrett MRN: [id number] Date of Birth: [birth year]

## 2018-02-19 ENCOUNTER — Ambulatory Visit: Payer: PRIVATE HEALTH INSURANCE | Admitting: Physical Therapy

## 2018-02-19 ENCOUNTER — Encounter: Payer: Self-pay | Admitting: Physical Therapy

## 2018-02-19 DIAGNOSIS — M25612 Stiffness of left shoulder, not elsewhere classified: Secondary | ICD-10-CM

## 2018-02-19 DIAGNOSIS — M25512 Pain in left shoulder: Secondary | ICD-10-CM

## 2018-02-19 DIAGNOSIS — M6281 Muscle weakness (generalized): Secondary | ICD-10-CM

## 2018-02-19 DIAGNOSIS — R29898 Other symptoms and signs involving the musculoskeletal system: Secondary | ICD-10-CM

## 2018-02-19 NOTE — Therapy (Signed)
[facility] [address]  [address] [place], [place], [address] Phone: [phone]   Fax:  [phone]  Physical Therapy Treatment  Patient Details  Name: Jimmy Barrett MRN: [id number] Date of Birth: 19-Mar-1986 Referring Provider: Jones Broom, MD   Encounter Date: 02/19/2018  PT End of Session - 02/19/18 1654    Visit Number  8    Number of Visits  13    Date for PT Re-Evaluation  03/13/18    Authorization Type  Workers comp- 1 eval, 12 visits    PT Start Time  1620    PT Stop Time  1703    PT Time Calculation (min)  43 min    Activity Tolerance  Patient tolerated treatment well;Patient limited by pain    Behavior During Therapy  [facility] for tasks assessed/performed       History reviewed. No pertinent past medical history.  Past Surgical History:  Procedure Laterality Date  . OTHER SURGICAL HISTORY     Shrapnel removed from leg in [place]    There were no vitals filed for this visit.  Subjective Assessment - 02/19/18 1621    Subjective  Reports increased L shoulder pain today after tossing and turning all night last night d/t pain. Reports he cannot recall anything that he did differently that could have caused the pain.    Diagnostic tests  x-ray: 01/06/18 L shoulder- "tiny osseous fragment in subacromial space, could be an old avulsion fx or calcific tendonitis."    Patient Stated Goals  get back to work and figure out what is wrong with my arm    Currently in Pain?  Yes    Pain Score  4     Pain Location  Shoulder    Pain Orientation  Left    Pain Descriptors / Indicators  Aching    Pain Type  Acute pain                       [facility] Adult PT Treatment/Exercise - 02/19/18 0001      Exercises   Exercises  Shoulder      Shoulder Exercises: Seated   Other Seated Exercises  B UE dynamic stabilization w/ blue weighted ball  overhead; 2x30 sec      Shoulder Exercises: Prone   Other Prone  Exercises  quadruped serratus push ups; 15x    Other Prone Exercises  I, T, Y leaning over orange pball; 10x each      Shoulder Exercises: ROM/Strengthening   UBE (Upper Arm Bike)  L1.5 forward 3/L1 back L 1 3      Electrical Stimulation   Electrical Stimulation Location  L shoulder complex     Electrical Stimulation Action  IFC    Electrical Stimulation Parameters  output 10, 15 min    Electrical Stimulation Goals  Pain      Vasopneumatic   Number Minutes Vasopneumatic   10 minutes    Vasopnuematic Location   Shoulder L    Vasopneumatic Pressure  Low    Vasopneumatic Temperature   Coldest temp      Manual Therapy   Manual Therapy  Joint mobilization;Passive ROM    Joint Mobilization  grade III/IV inferior and posterior joint mobs with long axis distraction L    Soft tissue mobilization  L pec, ant delt, and infraspinatus- TTP in L pec and ant delt; self STM to L anterior delt/pec with ball against wall and  foam roller    Passive ROM  L shoulder abd to tolerance               PT Short Term Goals - 02/02/18 1116      PT SHORT TERM GOAL #1   Title  Patient to be independent with initial HEP.    Time  1    Period  Weeks    Status  Achieved        PT Long Term Goals - 02/13/18 1624      PT LONG TERM GOAL #1   Title  Patient to be indpendent with advanced HEP.    Time  2    Period  Weeks    Status  Achieved      PT LONG TERM GOAL #2   Title  Patient to demonstrate L UE flexion, scaption, IR WFL in order promote functional use of UE at work.    Time  2    Period  Weeks    Status  On-going improving, see objective measures    Target Date  03/13/18      PT LONG TERM GOAL #3   Title  Patient to perform L sidelying without pain in order to allow for pain-free sleep.    Time  2    Period  Weeks    Status  Achieved patient unable to tolerate L sideling at this time      PT LONG TERM GOAL #4   Title  Patient to demonstrate >=4+/5 L UE strength in order to perform  work duties.    Time  2    Period  Weeks    Status  On-going 4/5 in L ER    Target Date  03/13/18      PT LONG TERM GOAL #5   Title  Patient to return back to work full time without pain limited.    Time  4    Period  Weeks    Status  New    Target Date  03/13/18            Plan - 02/19/18 1700    Clinical Impression Statement  Patient arrived to session with report that L shoulder pain is increased today- had trouble sleeping last night d/t pain. Patient tolerated L shoulder STM, joint mobs, and PROM- very TTP to L anterior delt and pec. Demonstrated to patient how to perform self-STM to these areas; patient with good carryover. Patient tolerated gentle periscapular strengthening with only mild c/o pain. Received e-stim and Gameready to L shoulder at end of session- normal integumentary response and relief of symptoms at end of session.    PT Treatment/Interventions  ADLs/Self Care Home Management;Cryotherapy;Electrical Stimulation;Ultrasound;Moist Heat;Iontophoresis 4mg /ml Dexamethasone;Therapeutic activities;Therapeutic exercise;Patient/family education;Manual techniques;Vasopneumatic Device;Taping;Splinting;Passive range of motion;Dry needling    PT Next Visit Plan  progress scapular and RTC strenghtening to tolerance    Consulted and Agree with Plan of Care  Patient       Patient will benefit from skilled therapeutic intervention in order to improve the following deficits and impairments:  Pain, Postural dysfunction, Decreased activity tolerance, Decreased range of motion, Decreased strength, Impaired UE functional use, Impaired flexibility  Visit Diagnosis: Acute pain of left shoulder  Stiffness of left shoulder, not elsewhere classified  Other symptoms and signs involving the musculoskeletal system  Muscle weakness (generalized)     Problem List There are no active problems to display for this patient.   Anette Guarneri, PT, DPT 02/19/18 6:00 PM  [facility] [address]  [address] [place], [place], [address] Phone: [phone]   Fax:  [phone]  Name: Arvid RightCasey J Botting MRN: [id number] Date of Birth: 10-19-1985

## 2018-02-21 ENCOUNTER — Ambulatory Visit: Payer: PRIVATE HEALTH INSURANCE

## 2018-02-21 DIAGNOSIS — M6281 Muscle weakness (generalized): Secondary | ICD-10-CM

## 2018-02-21 DIAGNOSIS — M25612 Stiffness of left shoulder, not elsewhere classified: Secondary | ICD-10-CM

## 2018-02-21 DIAGNOSIS — M25512 Pain in left shoulder: Secondary | ICD-10-CM

## 2018-02-21 DIAGNOSIS — R29898 Other symptoms and signs involving the musculoskeletal system: Secondary | ICD-10-CM

## 2018-02-21 NOTE — Therapy (Signed)
[facility] [address]  [address] [place], [place], [address] Phone: [phone]   Fax:  [phone]  Physical Therapy Treatment  Patient Details  Name: Jimmy Barrett MRN: [id number] Date of Birth: 1986/05/10 Referring Provider: Jones Broom, MD   Encounter Date: 02/21/2018  PT End of Session - 02/21/18 1625    Visit Number  9    Number of Visits  13    Date for PT Re-Evaluation  03/13/18    Authorization Type  Workers comp- 1 eval, 12 visits    Authorization - Visit Number  4    Authorization - Number of Visits  8    PT Start Time  1624    PT Stop Time  1712    PT Time Calculation (min)  48 min    Activity Tolerance  Patient tolerated treatment well;Patient limited by pain    Behavior During Therapy  [facility] for tasks assessed/performed       No past medical history on file.  Past Surgical History:  Procedure Laterality Date  . OTHER SURGICAL HISTORY     Shrapnel removed from leg in [place]    There were no vitals filed for this visit.  Subjective Assessment - 02/21/18 1625    Subjective  Pt. noting he feels better since last visit.      Diagnostic tests  x-ray: 01/06/18 L shoulder- "tiny osseous fragment in subacromial space, could be an old avulsion fx or calcific tendonitis."    Patient Stated Goals  get back to work and figure out what is wrong with my arm    Currently in Pain?  No/denies    Pain Score  0-No pain    Multiple Pain Sites  No                       [facility] Adult PT Treatment/Exercise - 02/21/18 1628      Shoulder Exercises: Prone   Other Prone Exercises  I's(1#), T's (1#), Y leaning over green pball; 15x each      Shoulder Exercises: Standing   External Rotation  Left;Strengthening;Theraband;Limitations;15 reps    Theraband Level (Shoulder External Rotation)  Level 2 (Red)    Internal Rotation  Strengthening;Left;Theraband;Limitations;15 reps    Theraband Level  (Shoulder Internal Rotation)  Level 2 (Red)    Flexion  Both;10 reps;Strengthening;Weights    Shoulder Flexion Weight (lbs)  1    Flexion Limitations  leaning on 1/2 foam bolster on wall     ABduction  Both;10 reps;Weights;Strengthening    Shoulder ABduction Weight (lbs)  1    ABduction Limitations  leaning on 1/2 foam bolster on wall     Extension  Both;10 reps;Theraband;Strengthening    Theraband Level (Shoulder Extension)  Level 3 (Green)    Row  10 reps;Both;Strengthening;Theraband    Theraband Level (Shoulder Row)  Level 3 (Green)    Other Standing Exercises  L shoulder green med ball (500Gr) toss in prone in 90/90 position x 20 sec       Shoulder Exercises: ROM/Strengthening   UBE (Upper Arm Bike)  L1.5 forward/back 3    Wall Pushups  15 reps    Wall Pushups Limitations  leaning on wall with pushup plus       Electrical Stimulation   Electrical Stimulation Location  L shoulder complex     Electrical Stimulation Action  IFC    Electrical Stimulation Parameters  to tolerance, 10'  Electrical Stimulation Goals  Pain      Vasopneumatic   Number Minutes Vasopneumatic   10 minutes    Vasopnuematic Location   Shoulder    Vasopneumatic Pressure  Low    Vasopneumatic Temperature   Coldest temp               PT Short Term Goals - 02/02/18 1116      PT SHORT TERM GOAL #1   Title  Patient to be independent with initial HEP.    Time  1    Period  Weeks    Status  Achieved        PT Long Term Goals - 02/13/18 1624      PT LONG TERM GOAL #1   Title  Patient to be indpendent with advanced HEP.    Time  2    Period  Weeks    Status  Achieved      PT LONG TERM GOAL #2   Title  Patient to demonstrate L UE flexion, scaption, IR WFL in order promote functional use of UE at work.    Time  2    Period  Weeks    Status  On-going improving, see objective measures    Target Date  03/13/18      PT LONG TERM GOAL #3   Title  Patient to perform L sidelying without pain  in order to allow for pain-free sleep.    Time  2    Period  Weeks    Status  Achieved patient unable to tolerate L sideling at this time      PT LONG TERM GOAL #4   Title  Patient to demonstrate >=4+/5 L UE strength in order to perform work duties.    Time  2    Period  Weeks    Status  On-going 4/5 in L ER    Target Date  03/13/18      PT LONG TERM GOAL #5   Title  Patient to return back to work full time without pain limited.    Time  4    Period  Weeks    Status  New    Target Date  03/13/18            Plan - 02/21/18 1627    Clinical Impression Statement  Vennie doing well today, reporting, "This is a good day, and it's not really hurting".  Tolerated increased focus today on scapular/RTC strengthening activities, which were well tolerated.  Ended session with E-stim/ice to lessen post-exercise swelling and pain.  Will monitor response to today's progression and continue per pt. in coming visits.      PT Treatment/Interventions  ADLs/Self Care Home Management;Cryotherapy;Electrical Stimulation;Ultrasound;Moist Heat;Iontophoresis 4mg /ml Dexamethasone;Therapeutic activities;Therapeutic exercise;Patient/family education;Manual techniques;Vasopneumatic Device;Taping;Splinting;Passive range of motion;Dry needling    PT Next Visit Plan  progress scapular and RTC strenghtening to tolerance    Consulted and Agree with Plan of Care  Patient       Patient will benefit from skilled therapeutic intervention in order to improve the following deficits and impairments:  Pain, Postural dysfunction, Decreased activity tolerance, Decreased range of motion, Decreased strength, Impaired UE functional use, Impaired flexibility  Visit Diagnosis: Acute pain of left shoulder  Stiffness of left shoulder, not elsewhere classified  Other symptoms and signs involving the musculoskeletal system  Muscle weakness (generalized)     Problem List There are no active problems to display for this  patient.   Kermit Balo, PTA 02/21/18  6:07 PM  [facility] [address]  [address] [place], [place], [address] Phone: [phone]   Fax:  [phone]  Name: Jimmy Barrett MRN: [id number] Date of Birth: [birth year]

## 2018-02-28 ENCOUNTER — Ambulatory Visit: Payer: PRIVATE HEALTH INSURANCE

## 2018-02-28 DIAGNOSIS — M25512 Pain in left shoulder: Secondary | ICD-10-CM

## 2018-02-28 DIAGNOSIS — R29898 Other symptoms and signs involving the musculoskeletal system: Secondary | ICD-10-CM

## 2018-02-28 DIAGNOSIS — M6281 Muscle weakness (generalized): Secondary | ICD-10-CM

## 2018-02-28 DIAGNOSIS — M25612 Stiffness of left shoulder, not elsewhere classified: Secondary | ICD-10-CM

## 2018-02-28 NOTE — Therapy (Signed)
[facility] [address]  [address] [place], [place], [address] Phone: [phone]   Fax:  [phone]  Physical Therapy Treatment  Patient Details  Name: Jimmy Barrett MRN: [id number] Date of Birth: Sep 29, 1985 Referring Provider: Jones Broom, MD   Encounter Date: 02/28/2018  PT End of Session - 02/28/18 1532    Visit Number  10    Number of Visits  13    Date for PT Re-Evaluation  03/13/18    Authorization Type  Workers comp- 1 eval, 12 visits    Authorization - Visit Number  5    Authorization - Number of Visits  8    PT Start Time  1530    PT Stop Time  1618    PT Time Calculation (min)  48 min    Activity Tolerance  Patient tolerated treatment well;Patient limited by pain    Behavior During Therapy  [facility] for tasks assessed/performed       History reviewed. No pertinent past medical history.  Past Surgical History:  Procedure Laterality Date  . OTHER SURGICAL HISTORY     Shrapnel removed from leg in [place]    There were no vitals filed for this visit.  Subjective Assessment - 02/28/18 1536    Subjective  Pt. noting he has been feeling well and released from MD to return to work on full duty.      Diagnostic tests  x-ray: 01/06/18 L shoulder- "tiny osseous fragment in subacromial space, could be an old avulsion fx or calcific tendonitis."    Patient Stated Goals  get back to work and figure out what is wrong with my arm    Currently in Pain?  No/denies    Pain Score  0-No pain    Multiple Pain Sites  No         [facility] PT Assessment - 02/28/18 1544      Assessment   Next MD Visit  7.10.19                   [facility] Adult PT Treatment/Exercise - 02/28/18 1545      Shoulder Exercises: Prone   Other Prone Exercises  I's(3#), T's (2#), Y (1#)  leaning over green pball; 15x each      Shoulder Exercises: Standing   External Rotation  Left;Strengthening;Theraband;Limitations;10 reps     Theraband Level (Shoulder External Rotation)  Level 3 (Green)    External Rotation Limitations  roll under elbow    Internal Rotation  10 reps;Left;Theraband;Strengthening;Limitations    Theraband Level (Shoulder Internal Rotation)  Level 2 (Red)    Internal Rotation Limitations  roll under elbow     Extension  Both;Theraband;Strengthening;15 reps    Theraband Level (Shoulder Extension)  Level 3 (Green)    Row  Both;15 reps;Strengthening;Theraband    Theraband Level (Shoulder Row)  Level 3 (Green)      Shoulder Exercises: ROM/Strengthening   UBE (Upper Arm Bike)  L1.5 forward/back 3    Lat Pull  15 reps    Lat Pull Limitations  20#    Cybex Row  15 reps    Cybex Row Limitations  20#    Wall Pushups  20 reps    Wall Pushups Limitations  leaning on wall with pushup plus     Other ROM/Strengthening Exercises  BATCA machine serratus punch 25# x 20 reps       Electrical Stimulation   Electrical Stimulation Location  L shoulder complex  Electrical Stimulation Action  IFC    Electrical Stimulation Parameters  to tolerance, 10'    Electrical Stimulation Goals  Pain      Vasopneumatic   Number Minutes Vasopneumatic   10 minutes    Vasopnuematic Location   Shoulder    Vasopneumatic Pressure  Low    Vasopneumatic Temperature   Coldest temp             PT Education - 02/28/18 1755    Education provided  Yes    Education Details  HEP update     Person(s) Educated  Patient    Methods  Explanation;Demonstration;Verbal cues;Handout    Comprehension  Verbalized understanding;Returned demonstration;Verbal cues required;Need further instruction       PT Short Term Goals - 02/02/18 1116      PT SHORT TERM GOAL #1   Title  Patient to be independent with initial HEP.    Time  1    Period  Weeks    Status  Achieved        PT Long Term Goals - 02/13/18 1624      PT LONG TERM GOAL #1   Title  Patient to be indpendent with advanced HEP.    Time  2    Period  Weeks    Status   Achieved      PT LONG TERM GOAL #2   Title  Patient to demonstrate L UE flexion, scaption, IR WFL in order promote functional use of UE at work.    Time  2    Period  Weeks    Status  On-going improving, see objective measures    Target Date  03/13/18      PT LONG TERM GOAL #3   Title  Patient to perform L sidelying without pain in order to allow for pain-free sleep.    Time  2    Period  Weeks    Status  Achieved patient unable to tolerate L sideling at this time      PT LONG TERM GOAL #4   Title  Patient to demonstrate >=4+/5 L UE strength in order to perform work duties.    Time  2    Period  Weeks    Status  On-going 4/5 in L ER    Target Date  03/13/18      PT LONG TERM GOAL #5   Title  Patient to return back to work full time without pain limited.    Time  4    Period  Weeks    Status  New    Target Date  03/13/18            Plan - 02/28/18 1533    Clinical Impression Statement  Pt. reporting that he saw orthopedic surgeon and surgeon released pt. to full time work without restrictions.  Starts full active duty work [employer] for full shift.  Pt. reporting MD wanting pt. to finish out remaining visits in POC.  Pt. tolerated progression of scapular/RTC strengthening activities well today without issue.  Requested ice/E-stim combo to end session as he reports good pain relief and relief from shoulder swelling for remainder of day following this.  Pt. progressing well.      Clinical Impairments Affecting Rehab Potential  --    PT Treatment/Interventions  ADLs/Self Care Home Management;Cryotherapy;Electrical Stimulation;Ultrasound;Moist Heat;Iontophoresis 4mg /ml Dexamethasone;Therapeutic activities;Therapeutic exercise;Patient/family education;Manual techniques;Vasopneumatic Device;Taping;Splinting;Passive range of motion;Dry needling    PT Next Visit Plan  progress scapular and RTC strenghtening to  tolerance    Consulted and Agree with Plan of Care  Patient        Patient will benefit from skilled therapeutic intervention in order to improve the following deficits and impairments:  Pain, Postural dysfunction, Decreased activity tolerance, Decreased range of motion, Decreased strength, Impaired UE functional use, Impaired flexibility  Visit Diagnosis: Acute pain of left shoulder  Stiffness of left shoulder, not elsewhere classified  Other symptoms and signs involving the musculoskeletal system  Muscle weakness (generalized)     Problem List There are no active problems to display for this patient.   Kermit Balo, PTA 02/28/18 6:07 PM  [facility] [address]  [address] [place], [place], [address] Phone: [phone]   Fax:  [phone]  Name: DAEVION NAVARETTE MRN: [id number] Date of Birth: 1986/04/21

## 2018-03-02 ENCOUNTER — Ambulatory Visit: Payer: PRIVATE HEALTH INSURANCE

## 2018-03-05 ENCOUNTER — Ambulatory Visit: Payer: PRIVATE HEALTH INSURANCE

## 2018-03-05 DIAGNOSIS — M25512 Pain in left shoulder: Secondary | ICD-10-CM

## 2018-03-05 DIAGNOSIS — M25612 Stiffness of left shoulder, not elsewhere classified: Secondary | ICD-10-CM

## 2018-03-05 DIAGNOSIS — M6281 Muscle weakness (generalized): Secondary | ICD-10-CM

## 2018-03-05 DIAGNOSIS — R29898 Other symptoms and signs involving the musculoskeletal system: Secondary | ICD-10-CM

## 2018-03-05 NOTE — Therapy (Addendum)
[facility] [address]  [address] [place], [place], [address] Phone: [phone]   Fax:  [phone]  Physical Therapy Treatment  Patient Details  Name: Jimmy Barrett MRN: [id number] Date of Birth: December 28, 1985 Referring Provider: Jones Broom, MD   Encounter Date: 03/05/2018  PT End of Session - 03/05/18 0800    Visit Number  11    Number of Visits  13    Date for PT Re-Evaluation  03/13/18    Authorization Type  Workers comp- 1 eval, 12 visits    Authorization - Visit Number  6    Authorization - Number of Visits  8    PT Start Time  0801    PT Stop Time  0855    PT Time Calculation (min)  54 min    Activity Tolerance  Patient tolerated treatment well;Patient limited by pain    Behavior During Therapy  [facility] for tasks assessed/performed       No past medical history on file.  Past Surgical History:  Procedure Laterality Date  . OTHER SURGICAL HISTORY     Shrapnel removed from leg in [place]    There were no vitals filed for this visit.  Subjective Assessment - 03/05/18 0805    Subjective  Pt. noting he has not been getting good sleep due to keeping his friend's three year old son to help out friends.      Diagnostic tests  x-ray: 01/06/18 L shoulder- "tiny osseous fragment in subacromial space, could be an old avulsion fx or calcific tendonitis."    Patient Stated Goals  get back to work and figure out what is wrong with my arm    Currently in Pain?  No/denies    Pain Score  0-No pain    Multiple Pain Sites  No                       [facility] Adult PT Treatment/Exercise - 03/05/18 0811      Shoulder Exercises: Prone   Other Prone Exercises  Quadruped alternating UE/LE raise ("birddog") x 10 reps     Other Prone Exercises  I's(3#), T's (3#), Y (3#)  leaning over green pball; 10x each      Shoulder Exercises: Standing   Row  15 reps;Both    Row Limitations  TRX    Other Standing  Exercises  L red med ball (1000Gr) medball catrch on toss on rebounder x 1 min each direction       Shoulder Exercises: ROM/Strengthening   UBE (Upper Arm Bike)  L1.5 forward/back 3    Lat Pull  15 reps    Lat Pull Limitations  25#    Cybex Row  10 reps    Cybex Row Limitations  25#    Wall Pushups  20 reps    Wall Pushups Limitations  leaning on wall with orange p-ball     Pushups  10 reps at counter top    Plank  30 seconds;2 reps    Other ROM/Strengthening Exercises  Prone 90/90 drop and catch with red medball (1000Gr)     Other ROM/Strengthening Exercises  Prone R/L rocking in pushup position leaning on BOSU ball (down) on edge of mat table with LE on floor x 15 each way       Shoulder Exercises: Body Blade   Flexion  30 seconds;1 rep B     ABduction  30 seconds;1 rep  External Rotation  30 seconds;1 rep neutral       Vasopneumatic   Number Minutes Vasopneumatic   10 minutes    Vasopnuematic Location   Shoulder    Vasopneumatic Pressure  Low    Vasopneumatic Temperature   Coldest temp             PT Education - 03/05/18 1231    Education provided  Yes    Education Details  HEP update     Methods  Explanation;Demonstration;Verbal cues;Handout    Comprehension  Verbalized understanding;Returned demonstration;Verbal cues required;Need further instruction       PT Short Term Goals - 02/02/18 1116      PT SHORT TERM GOAL #1   Title  Patient to be independent with initial HEP.    Time  1    Period  Weeks    Status  Achieved        PT Long Term Goals - 02/13/18 1624      PT LONG TERM GOAL #1   Title  Patient to be indpendent with advanced HEP.    Time  2    Period  Weeks    Status  Achieved      PT LONG TERM GOAL #2   Title  Patient to demonstrate L UE flexion, scaption, IR WFL in order promote functional use of UE at work.    Time  2    Period  Weeks    Status  On-going improving, see objective measures    Target Date  03/13/18      PT LONG TERM  GOAL #3   Title  Patient to perform L sidelying without pain in order to allow for pain-free sleep.    Time  2    Period  Weeks    Status  Achieved patient unable to tolerate L sideling at this time      PT LONG TERM GOAL #4   Title  Patient to demonstrate >=4+/5 L UE strength in order to perform work duties.    Time  2    Period  Weeks    Status  On-going 4/5 in L ER    Target Date  03/13/18      PT LONG TERM GOAL #5   Title  Patient to return back to work full time without pain limited.    Time  4    Period  Weeks    Status  New    Target Date  03/13/18            Plan - 03/05/18 0801    Clinical Impression Statement  Jimmy Barrett doing well today noting he has not had shoulder pain in over a week.  Tolerated progression of RTC/scapular strengthening activities and progression of rhythmic stabilization activities well today without pain.  Did note UE fatigue to end session.  HEP updated with machine Lat Pulldown and machine row as pt. with plans to return to gym at work.  Will continue to progress toward goals.    PT Treatment/Interventions  ADLs/Self Care Home Management;Cryotherapy;Electrical Stimulation;Ultrasound;Moist Heat;Iontophoresis 4mg /ml Dexamethasone;Therapeutic activities;Therapeutic exercise;Patient/family education;Manual techniques;Vasopneumatic Device;Taping;Splinting;Passive range of motion;Dry needling    PT Next Visit Plan  progress scapular and RTC strenghtening to tolerance    Consulted and Agree with Plan of Care  Patient       Patient will benefit from skilled therapeutic intervention in order to improve the following deficits and impairments:  Pain, Postural dysfunction, Decreased activity tolerance, Decreased range of motion, Decreased  strength, Impaired UE functional use, Impaired flexibility  Visit Diagnosis: Acute pain of left shoulder  Stiffness of left shoulder, not elsewhere classified  Other symptoms and signs involving the musculoskeletal  system  Muscle weakness (generalized)     Problem List There are no active problems to display for this patient.   Kermit BaloMicah Valynn Schamberger, PTA 03/06/18 1:06 PM  [facility] [place] [address]  [address] [place], [place], [address] Phone: [phone]   Fax:  [phone]  Name: Jimmy Barrett MRN: [id number] Date of Birth: 18-Feb-1986

## 2018-03-07 ENCOUNTER — Ambulatory Visit: Payer: PRIVATE HEALTH INSURANCE | Admitting: Physical Therapy

## 2018-03-07 ENCOUNTER — Encounter: Payer: Self-pay | Admitting: Physical Therapy

## 2018-03-07 DIAGNOSIS — M25512 Pain in left shoulder: Secondary | ICD-10-CM | POA: Diagnosis not present

## 2018-03-07 DIAGNOSIS — M6281 Muscle weakness (generalized): Secondary | ICD-10-CM

## 2018-03-07 DIAGNOSIS — M25612 Stiffness of left shoulder, not elsewhere classified: Secondary | ICD-10-CM

## 2018-03-07 DIAGNOSIS — R29898 Other symptoms and signs involving the musculoskeletal system: Secondary | ICD-10-CM

## 2018-03-07 NOTE — Therapy (Signed)
[facility] [address]  [place], [place], [address] Phone: [phone]   Fax:  [phone]  Physical Therapy Discharge  Patient Details  Name: Jimmy Barrett MRN: [id number] Date of Birth: 1986-01-22 Referring Provider: Tania Ade, MD   Progress Note Reporting Period 02/06/18 to 03/07/18  See note below for Objective Data and Assessment of Progress/Goals.    Encounter Date: 03/07/2018  PT End of Session - 03/07/18 1703    Visit Number  12    Number of Visits  13    Date for PT Re-Evaluation  03/13/18    Authorization Type  Workers comp- 1 eval, 12 visits    Authorization - Visit Number  7    Authorization - Number of Visits  8    PT Start Time  1622    PT Stop Time  1700    PT Time Calculation (min)  38 min    Activity Tolerance  Patient tolerated treatment well    Behavior During Therapy  WFL for tasks assessed/performed       History reviewed. No pertinent past medical history.  Past Surgical History:  Procedure Laterality Date  . OTHER SURGICAL HISTORY     Shrapnel removed from leg in [place]    There were no vitals filed for this visit.  Subjective Assessment - 03/07/18 1624    Subjective  Patient reports he is 100% improved, not needing to use ice or e-stim. Cleared to go back to work but taking it easy. Reports he feels like himself again.    Diagnostic tests  x-ray: 01/06/18 L shoulder- "tiny osseous fragment in subacromial space, could be an old avulsion fx or calcific tendonitis."    Patient Stated Goals  get back to work and figure out what is wrong with my arm    Currently in Pain?  No/denies         [facility] PT Assessment - 03/07/18 0001      Observation/Other Assessments   Focus on Therapeutic Outcomes (FOTO)   Shoulder: 100 (0% limited, 26% predicted)      AROM   AROM Assessment Site  Shoulder    Right/Left Shoulder  Left    Left Shoulder Flexion  175 Degrees    Left  Shoulder ABduction  180 Degrees    Left Shoulder Internal Rotation  91 Degrees    Left Shoulder External Rotation  69 Degrees      PROM   PROM Assessment Site  Shoulder    Right/Left Shoulder  Left    Left Shoulder Flexion  175 Degrees    Left Shoulder ABduction  180 Degrees    Left Shoulder Internal Rotation  75 Degrees    Left Shoulder External Rotation  80 Degrees      Strength   Left Shoulder Flexion  4+/5    Left Shoulder ABduction  4+/5    Left Shoulder Internal Rotation  4+/5    Left Shoulder External Rotation  4+/5                   [facility] Adult PT Treatment/Exercise - 03/07/18 0001      Exercises   Exercises  Shoulder;Lumbar      Lumbar Exercises: Quadruped   Plank  2x40 sec in push up position      Shoulder Exercises: Prone   Retraction  Strengthening;10 reps;Limitations    Retraction Limitations  serratus push up plus in push up position; VCs to  keep spine neutral    Other Prone Exercises  Quadruped alternating UE/LE raise ("birddog") x 10 reps       Shoulder Exercises: Sidelying   Other Sidelying Exercises  lateral plank lifts on elbows; 10x each side      Shoulder Exercises: Standing   Other Standing Exercises  wall pushups elbows out 10x VCs for hand placement      Shoulder Exercises: ROM/Strengthening   UBE (Upper Arm Bike)  L2.0 3/3    Lat Pull  15 reps    Lat Pull Limitations  25# cues for scap retraction    Cybex Row  15 reps    Cybex Row Limitations  25# wide grip TCs to promote scap retraction and avoid shoulder hike             PT Education - 03/07/18 1703    Education provided  Yes    Education Details  Updated HEP    Person(s) Educated  Patient    Methods  Explanation;Demonstration;Tactile cues;Verbal cues;Handout    Comprehension  Returned demonstration;Verbalized understanding       PT Short Term Goals - 03/07/18 1707      PT SHORT TERM GOAL #1   Title  Patient to be independent with initial HEP.    Time  1     Period  Weeks    Status  Achieved        PT Long Term Goals - 03/07/18 1627      PT LONG TERM GOAL #1   Title  Patient to be indpendent with advanced HEP.    Time  2    Period  Weeks    Status  Achieved      PT LONG TERM GOAL #2   Title  Patient to demonstrate L UE flexion, scaption, IR WFL in order promote functional use of UE at work.    Time  2    Period  Weeks    Status  Achieved L UE AROM/PROM WFL- see objective measures      PT LONG TERM GOAL #3   Title  Patient to perform L sidelying without pain in order to allow for pain-free sleep.    Time  2    Period  Weeks    Status  Achieved patient able to tolerate L sideling with no pain      PT LONG TERM GOAL #4   Title  Patient to demonstrate >=4+/5 L UE strength in order to perform work duties.    Time  2    Period  Weeks    Status  Achieved 4+/5 strength in L UE      PT LONG TERM GOAL #5   Title  Patient to return back to work full time without pain limited.    Time  4    Period  Weeks    Status  Achieved            Plan - 03/07/18 1704    Clinical Impression Statement  Patient arrived to session with report that he is 100% improved and no c/o pain since before the weekend. Updated goals this session- patient has met all goals at this time. Has shown significant strength and AROM/PROM improvements without c/o pain and reports that he is back to work full time. Today patient tolerated all WBing core and scapular strengthening ther-ex without c/o pain today. Also tolerated machine strengthening with VCs to correct form and no pain- addition of lat pulldown and row to  HEP this date. Patient received handout and reported understanding. Patient is pain free and independent with pain management. Patient to be discharged at this time d/t meeting all goals. Patient in agreement.     PT Treatment/Interventions  ADLs/Self Care Home Management;Cryotherapy;Electrical Stimulation;Ultrasound;Moist Heat;Iontophoresis 37m/ml  Dexamethasone;Therapeutic activities;Therapeutic exercise;Patient/family education;Manual techniques;Vasopneumatic Device;Taping;Splinting;Passive range of motion;Dry needling    PT Next Visit Plan  D/C at this time    Consulted and Agree with Plan of Care  Patient       Patient will benefit from skilled therapeutic intervention in order to improve the following deficits and impairments:  Pain, Postural dysfunction, Decreased activity tolerance, Decreased range of motion, Decreased strength, Impaired UE functional use, Impaired flexibility  Visit Diagnosis: Acute pain of left shoulder  Stiffness of left shoulder, not elsewhere classified  Other symptoms and signs involving the musculoskeletal system  Muscle weakness (generalized)     Problem List There are no active problems to display for this patient.   PHYSICAL THERAPY DISCHARGE SUMMARY  Visits from Start of Care: 12  Current functional level related to goals / functional outcomes: See above   Remaining deficits: None   Education / Equipment: Updated HEP, given handout Plan: Patient agrees to discharge.  Patient goals were met. Patient is being discharged due to meeting the stated rehab goals.  ?????       YJanene Harvey PT, DPT 03/07/18 5:12 PM  [facility] [place] [address]Phone: [phone]  Fax:  [phone] Name: Jimmy GLADNEYMRN: [id number] of Birth: 1March 31, 1987

## 2018-03-09 ENCOUNTER — Ambulatory Visit: Payer: PRIVATE HEALTH INSURANCE | Admitting: Physical Therapy

## 2018-03-13 ENCOUNTER — Ambulatory Visit: Payer: PRIVATE HEALTH INSURANCE | Admitting: Physical Therapy

## 2018-08-23 ENCOUNTER — Encounter (HOSPITAL_COMMUNITY): Payer: Self-pay

## 2018-08-23 ENCOUNTER — Other Ambulatory Visit: Payer: Self-pay

## 2018-08-23 ENCOUNTER — Emergency Department (HOSPITAL_COMMUNITY): Payer: 59

## 2018-08-23 ENCOUNTER — Emergency Department (HOSPITAL_COMMUNITY)
Admission: EM | Admit: 2018-08-23 | Discharge: 2018-08-23 | Disposition: A | Discharge status: Home / Self Care | Payer: 59 | Attending: Emergency Medicine | Admitting: Emergency Medicine

## 2018-08-23 DIAGNOSIS — Y999 Unspecified external cause status: Secondary | ICD-10-CM | POA: Insufficient documentation

## 2018-08-23 DIAGNOSIS — M7989 Other specified soft tissue disorders: Secondary | ICD-10-CM | POA: Diagnosis not present

## 2018-08-23 DIAGNOSIS — Y939 Activity, unspecified: Secondary | ICD-10-CM | POA: Insufficient documentation

## 2018-08-23 DIAGNOSIS — Y929 Unspecified place or not applicable: Secondary | ICD-10-CM | POA: Diagnosis not present

## 2018-08-23 DIAGNOSIS — W450XXA Nail entering through skin, initial encounter: Secondary | ICD-10-CM | POA: Insufficient documentation

## 2018-08-23 DIAGNOSIS — S61032A Puncture wound without foreign body of left thumb without damage to nail, initial encounter: Secondary | ICD-10-CM | POA: Insufficient documentation

## 2018-08-23 DIAGNOSIS — M79645 Pain in left finger(s): Secondary | ICD-10-CM | POA: Diagnosis not present

## 2018-08-23 DIAGNOSIS — S6992XA Unspecified injury of left wrist, hand and finger(s), initial encounter: Secondary | ICD-10-CM | POA: Diagnosis not present

## 2018-08-23 MED ORDER — LIDOCAINE-EPINEPHRINE-TETRACAINE (LET) SOLUTION
3.0000 mL | Freq: Once | NASAL | Status: AC
  Administered 2018-08-23: 3 mL via TOPICAL
  Filled 2018-08-23: qty 3

## 2018-08-23 MED ORDER — CEPHALEXIN 500 MG PO CAPS
500.0000 mg | ORAL_CAPSULE | Freq: Once | ORAL | Status: AC
Start: 2018-08-23 — End: 2018-08-23
  Administered 2018-08-23: 500 mg via ORAL
  Filled 2018-08-23: qty 1

## 2018-08-23 MED ORDER — CEPHALEXIN 500 MG PO CAPS: 500.0000 mg | ORAL_CAPSULE | Freq: Four times a day (QID) | ORAL | 0 refills | Status: DC

## 2018-08-23 MED ORDER — LIDOCAINE HCL 2 % IJ SOLN
10.0000 mL | Freq: Once | INTRAMUSCULAR | Status: AC
  Administered 2018-08-23: 200 mg
  Filled 2018-08-23: qty 20

## 2018-08-23 MED ORDER — ACETAMINOPHEN 500 MG PO TABS
1000.0000 mg | ORAL_TABLET | Freq: Once | ORAL | Status: AC
  Administered 2018-08-23: 1000 mg via ORAL
  Filled 2018-08-23: qty 2

## 2018-08-23 MED ORDER — IBUPROFEN 600 MG PO TABS: 600.0000 mg | ORAL_TABLET | Freq: Four times a day (QID) | ORAL | 0 refills | Status: DC | PRN

## 2018-08-23 NOTE — Discharge Instructions (Addendum)
Please see the information and instructions below regarding your visit.  Your diagnoses today include:  1. Puncture wound of left thumb, initial encounter     Tests performed today include: See side panel of your discharge paperwork for testing performed today. Vital signs are listed at the bottom of these instructions.   X-ray showed no break in the bone or metal foreign body.  Medications prescribed:    Take any prescribed medications only as prescribed, and any over the counter medications only as directed on the packaging.  Please take all of your antibiotics until finished.   You may develop abdominal discomfort or nausea from the antibiotic. If this occurs, you may take it with food. Some patients also get diarrhea with antibiotics. You may help offset this with probiotics which you can buy or get in yogurt. Do not eat or take the probiotics until 2 hours after your antibiotic. Some women develop vaginal yeast infections after antibiotics. If you develop unusual vaginal discharge after being on this medication, please see your primary care provider.   Some people develop allergies to antibiotics. Symptoms of antibiotic allergy can be mild and include a flat rash and itching. They can also be more serious and include:  ?Hives - Hives are raised, red patches of skin that are usually very itchy.  ?Lip or tongue swelling  ?Trouble swallowing or breathing  ?Blistering of the skin or mouth.  If you have any of these serious symptoms, please seek emergency medical care immediately.  You are prescribed ibuprofen, a non-steroidal anti-inflammatory agent (NSAID) for pain. You may take 600 mg every 6  hours as needed for pain. If still requiring this medication around the clock for acute pain after 10 days, please see your primary healthcare provider.  Women who are pregnant, breastfeeding, or planning on becoming pregnant should not take non-steroidal anti-inflammatories such as Advil and  Aleve. Tylenol is a safe over the counter pain reliever in pregnant women.  You may combine this medication with Tylenol, 650 mg every 6 hours, so you are receiving something for pain every 3 hours.  This is not a long-term medication unless under the care and direction of your primary provider. Taking this medication long-term and not under the supervision of a healthcare provider could increase the risk of stomach ulcers, kidney problems, and cardiovascular problems such as high blood pressure.   Home care instructions:  Please follow any educational materials contained in this packet.   Please continue to ice the thumb.  Please place a towel in between the thumb and your ice pack.  Please use a splint for comfort.  Please use it for 7 days.  Follow-up instructions:  Please follow up with Dr. Merlyn LotKuzma as needed.   Return instructions:  Please return to the Emergency Department if you experience worsening symptoms.  Please return to the emergency department if you develop any worsening pain, swelling, drainage that is pus in nature, or increasing redness. Please return if you have any other emergent concerns.  Additional Information:   Your vital signs today were: BP 126/77 (BP Location: Right Arm)    Pulse 73    Temp 98.7 F (37.1 C) (Oral)    Resp 17    Ht 5\' 9"  (1.753 m)    Wt 91.6 kg    SpO2 98%    BMI 29.83 kg/m  If your blood pressure (BP) was elevated on multiple readings during this visit above 130 for the top number or above 80  for the bottom number, please have this repeated by your primary care provider within one month. --------------  Thank you for allowing [place] to participate in your care today.

## 2018-08-23 NOTE — ED Triage Notes (Signed)
patient reports that he hit his left thumb with a nail gun today. patient was wearing gloves.

## 2018-08-23 NOTE — ED Provider Notes (Signed)
[facility]-EMERGENCY DEPT Provider Note   CSN: [id number] Arrival date & time: 08/23/18  1538     History   Chief Complaint Chief Complaint  Patient presents with  . thumb injury    HPI Jimmy Barrett is a 32 y.o. male.  HPI  Patient is a 32 year old male with no significant past medical history presenting for left thumb injury.  Patient reports that just prior to arrival at the emergency department, a nail was injected upwards into his thumb from a nail gun.  It landed in the fatty pad of the finger distal to the IP joint.  Patient ports that it swelled immediately, and he has a numbness around the area of puncture.  Patient ports he is having difficulty flexing the thumb.  Tetanus shot administered 1 year ago.  History reviewed. No pertinent past medical history.  There are no active problems to display for this patient.   Past Surgical History:  Procedure Laterality Date  . OTHER SURGICAL HISTORY     [place] from leg [place]        Home Medications    Prior to Admission medications   Medication Sig Start Date End Date Taking? Authorizing Provider  naproxen (NAPROSYN) 500 MG tablet Take 500 mg by mouth 2 (two) times daily with a meal.    [provider]    Family History Family History  Problem Relation Age of Onset  . Cancer Father        colon cancer    Social History Social History   Tobacco Use  . Smoking status: Never Smoker  . Smokeless tobacco: Never Used  Substance Use Topics  . Alcohol use: Yes    Comment: socially  . Drug use: No     Allergies   Patient has no known allergies.   Review of Systems Review of Systems  Musculoskeletal: Positive for arthralgias and joint swelling.  Skin: Positive for wound.  Neurological: Negative for weakness and numbness.     Physical Exam Updated Vital Signs BP (!) 144/100 (BP Location: Right Arm)   Pulse 97   Temp 98.7 F (37.1 C) (Oral)   Resp  16   Ht 5\' 9"  (1.753 m)   Wt 91.6 kg   SpO2 97%   BMI 29.83 kg/m   Physical Exam Vitals signs and nursing note reviewed.  Constitutional:      General: He is not in acute distress.    Appearance: He is well-developed. He is not diaphoretic.     Comments: Sitting comfortably in bed.  HENT:     Head: Normocephalic and atraumatic.  Eyes:     General:        Right eye: No discharge.        Left eye: No discharge.     Conjunctiva/sclera: Conjunctivae normal.     Comments: EOMs normal to gross examination.  Neck:     Musculoskeletal: Normal range of motion.  Cardiovascular:     Rate and Rhythm: Normal rate and regular rhythm.     Comments: Intact, 2+ radial pulse of LUE.  Abdominal:     General: There is no distension.  Musculoskeletal:        General: Swelling present.     Comments: Examination of the left thumb reveals puncture wound in the pad of the left thumb just distal to the IP joint.  Patient has intact sensation to sharp touch in all distributions of the digital nerves.  Patient is  able to fully extend the thumb.  Patient does have difficulty with flexion, but is able to perform flexing motion.  Skin:    General: Skin is warm and dry.  Neurological:     Mental Status: He is alert.     Comments: Cranial nerves intact to gross observation. Patient moves extremities without difficulty.  Psychiatric:        Behavior: Behavior normal.        Thought Content: Thought content normal.        Judgment: Judgment normal.      ED Treatments / Results  Labs (all labs ordered are listed, but only abnormal results are displayed) Labs Reviewed - No data to display  EKG None  Radiology No results found.  Procedures Procedures (including critical care time)  Medications Ordered in ED Medications - No data to display   Initial Impression / Assessment and Plan / ED Course  I have reviewed the triage vital signs and the nursing notes.  Pertinent labs & imaging results  that were available during my care of the patient were reviewed by me and considered in my medical decision making (see chart for details).     Patient well-appearing and her vascular intact in left upper extremity.  Patient with puncture wound to the pad of the left thumb.  This is distal to the IP joint.  When patient was digitally blocked, he was able to flex and extend the thumb.  Do not suspect flexor tendon involvement.  There is no skeletal involvement on x-ray.  Wound was copiously cleansed with past milliliters of normal saline.  Splinted in place.  Instructed patient to ice, and will prescribe prophylactic antibiotics.  Patient struck to that if he still has decreased range of motion with flexion in 5 days, he should follow-up with hand surgery.   Return precautions given for any increasing erythema, swelling, purulent drainage.  Patient is in understanding and agrees with plan of care.  This is a supervised visit with Dr. Mancel Bale. Evaluation, management, and discharge planning discussed with this attending physician. Imaging reviewed with attending physician.   Final Clinical Impressions(s) / ED Diagnoses   Final diagnoses:  Puncture wound of left thumb, initial encounter    ED Discharge Orders         Ordered    cephALEXin (KEFLEX) 500 MG capsule  4 times daily     08/23/18 2022    ibuprofen (ADVIL,MOTRIN) 600 MG tablet  Every 6 hours PRN     08/23/18 2022           Delia Chimes 08/23/18 2026    Mancel Bale, MD 08/24/18 1301

## 2018-08-23 NOTE — ED Notes (Signed)
Pt verbalized discharge instructions and follow up care.   

## 2018-08-24 MED FILL — IBUPROFEN 600 MG TABLET: 600 | 7 days supply | Qty: 30 | Fill #0

## 2018-08-24 MED FILL — CEPHALEXIN 500 MG CAPSULE: 500 | 4 days supply | Qty: 19 | Fill #0

## 2019-06-22 ENCOUNTER — Emergency Department (HOSPITAL_COMMUNITY): Payer: PRIVATE HEALTH INSURANCE

## 2019-06-22 ENCOUNTER — Emergency Department (HOSPITAL_COMMUNITY)
Admission: EM | Admit: 2019-06-22 | Discharge: 2019-06-22 | Disposition: A | Discharge status: Home / Self Care | Payer: PRIVATE HEALTH INSURANCE | Attending: Emergency Medicine | Admitting: Emergency Medicine

## 2019-06-22 ENCOUNTER — Other Ambulatory Visit: Payer: Self-pay

## 2019-06-22 ENCOUNTER — Encounter (HOSPITAL_COMMUNITY): Payer: Self-pay | Admitting: Emergency Medicine

## 2019-06-22 DIAGNOSIS — Y999 Unspecified external cause status: Secondary | ICD-10-CM | POA: Diagnosis not present

## 2019-06-22 DIAGNOSIS — Z79899 Other long term (current) drug therapy: Secondary | ICD-10-CM | POA: Diagnosis not present

## 2019-06-22 DIAGNOSIS — S4992XA Unspecified injury of left shoulder and upper arm, initial encounter: Secondary | ICD-10-CM | POA: Diagnosis not present

## 2019-06-22 DIAGNOSIS — R030 Elevated blood-pressure reading, without diagnosis of hypertension: Secondary | ICD-10-CM | POA: Insufficient documentation

## 2019-06-22 DIAGNOSIS — Y929 Unspecified place or not applicable: Secondary | ICD-10-CM | POA: Diagnosis not present

## 2019-06-22 DIAGNOSIS — Y939 Activity, unspecified: Secondary | ICD-10-CM | POA: Diagnosis not present

## 2019-06-22 NOTE — ED Triage Notes (Signed)
Pt verbalizing left shoulder pain post being kicked multiple times while at work. Pt works as Animal nutritionist.

## 2019-06-22 NOTE — Discharge Instructions (Addendum)
I have provided you with an orthopedics phone number. Please call their office if your pain continues or gets worse. I recommend taking over the counter ibuprofen for pain and swelling in addition to ice. Please follow-up with your PCP for elevated blood pressure.
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
## 2019-09-28 IMAGING — CR DG SHOULDER 2+V*L*
3 series · 3 of 3 positions shown · non-contrast
Comparison: None.

CLINICAL DATA: Left shoulder pain after injury. History of previous
dislocations.

EXAM:
LEFT SHOULDER - 2+ VIEW

[w shoulder external left]
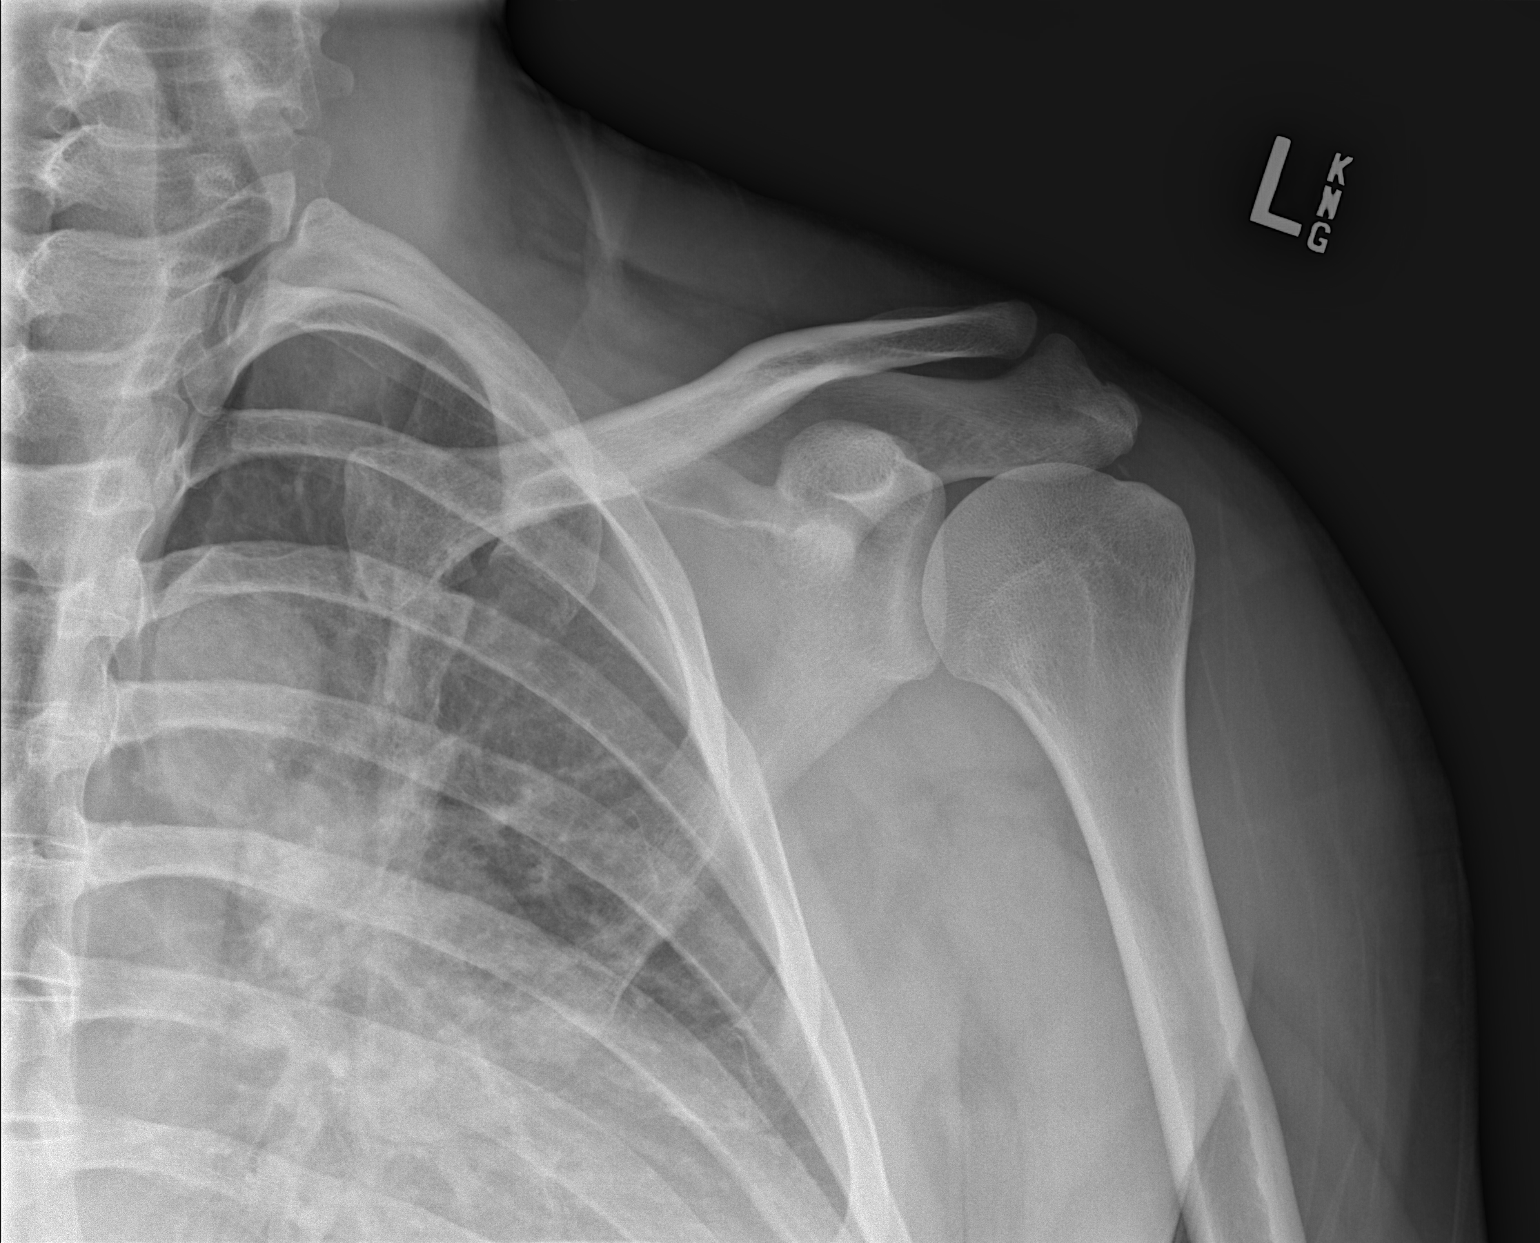

[w shoulder y-view left]
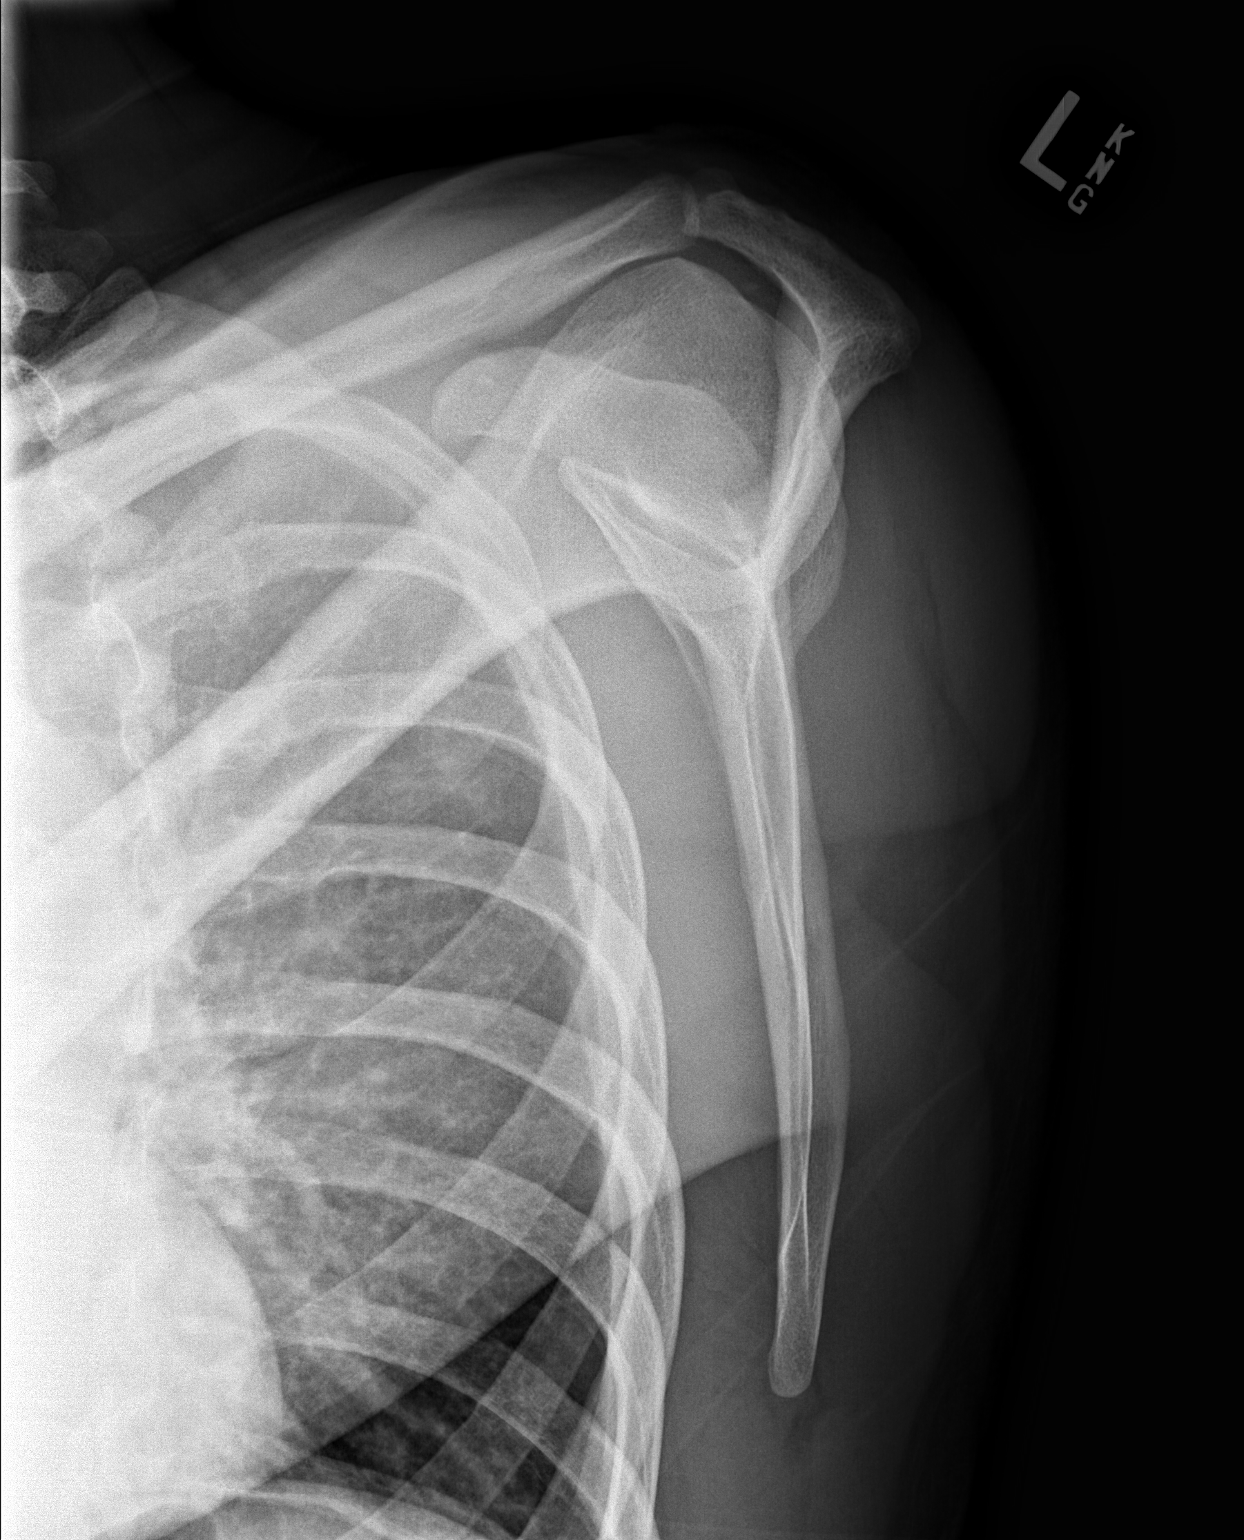

[x shoulder axillary left]
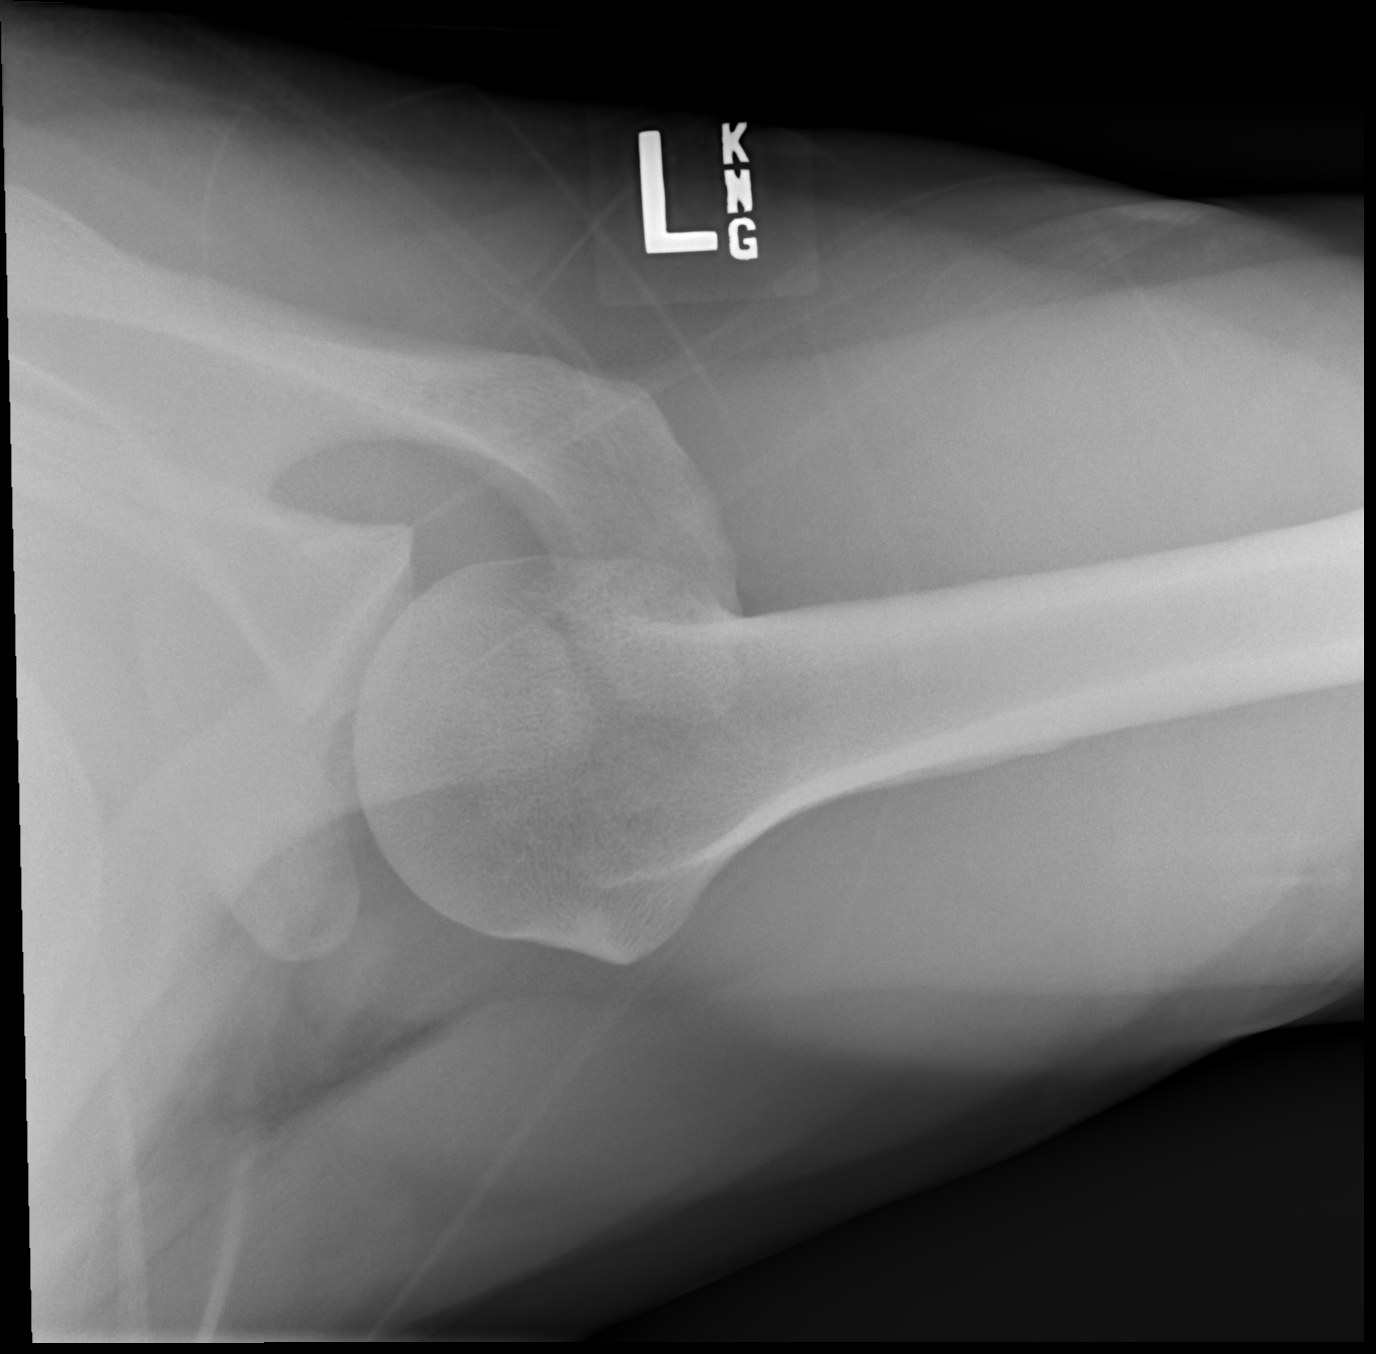

[3 of 3 positions shown; findings below may reference images not displayed]

FINDINGS: There is a tiny osseous fragment in the subacromial space. This
could represent an acute or old avulsion fragment or it could
indicate calcific tendinosis. There is no acute displaced fracture
identified. No dislocation of the glenohumeral or acromioclavicular
joints. No focal bone lesion or bone destruction. Soft tissues are
unremarkable.
IMPRESSION: Nonspecific osseous fragment in the subacromial space could
represent acute or old avulsion or calcific tendinitis. Otherwise,
no acute fracture or dislocation.

## 2019-10-26 IMAGING — MR MR SHOULDER*L* W/O CM
4 of 5 series · 19 of 40 positions shown · non-contrast
Comparison: 01/06/2018

CLINICAL DATA: Left shoulder pain over the last month after
exertion. Popping, clicking, and grinding.

EXAM:
MRI OF THE LEFT SHOULDER WITHOUT CONTRAST
TECHNIQUE: Multiplanar, multisequence MR imaging of the shoulder was performed.
No intravenous contrast was administered.

[Series 4: T2 fat-sat · axial · 4.0mm · 0.27mm/px · z∈[-55,+25]mm · 5 of 20 slices shown (1 of 3)]
[im 1/20]
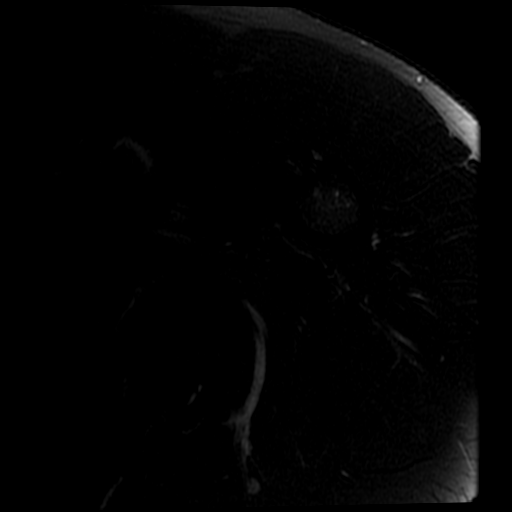
[im 3/20]
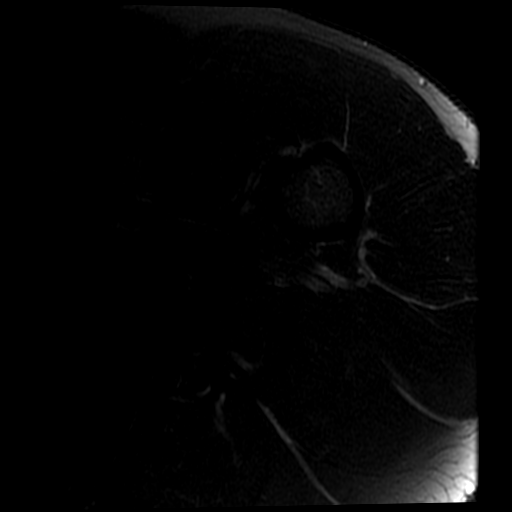
[im 6/20]
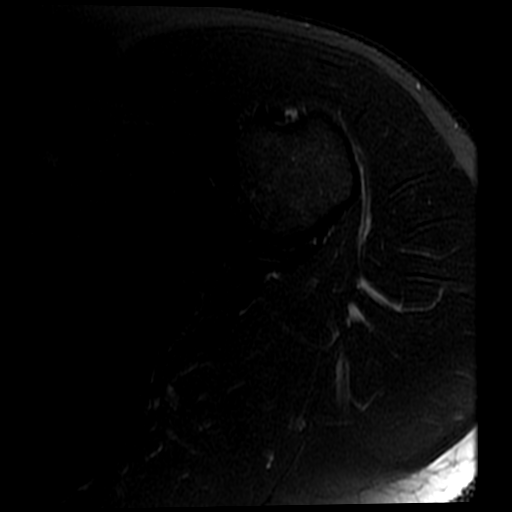
[im 11/20]
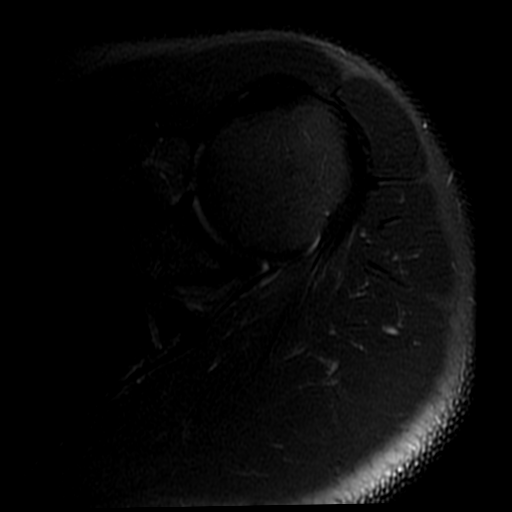
[im 17/20]
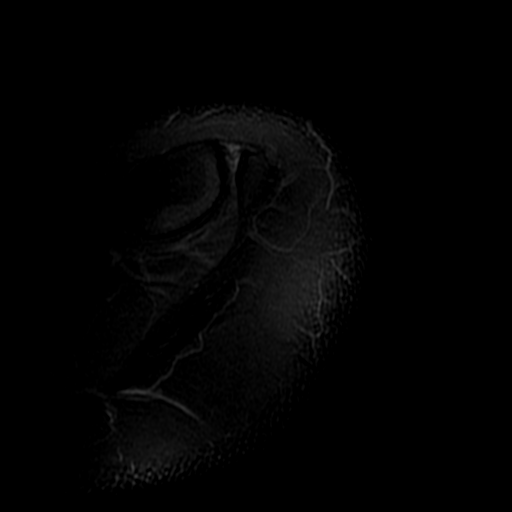

[Series 5: T2 fat-sat · oblique · 4.0mm · 0.29mm/px · 3 of 18 slices shown (2 of 3)]
[im 3/18]
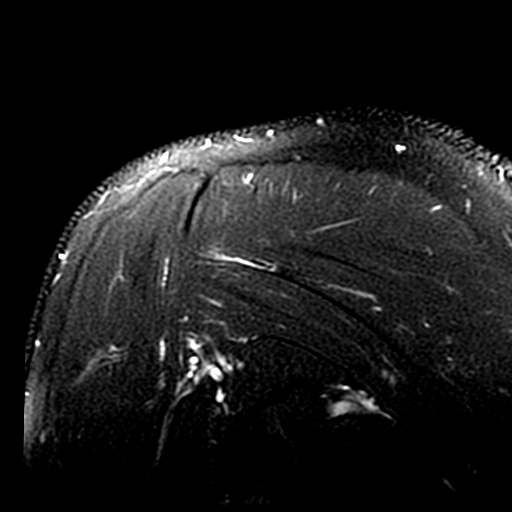
[im 10/18]
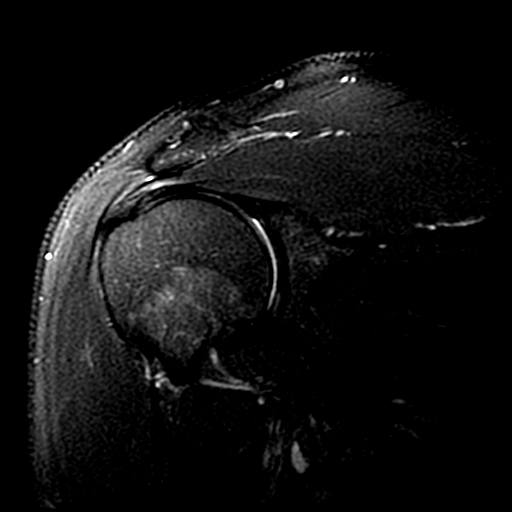
[im 15/18]
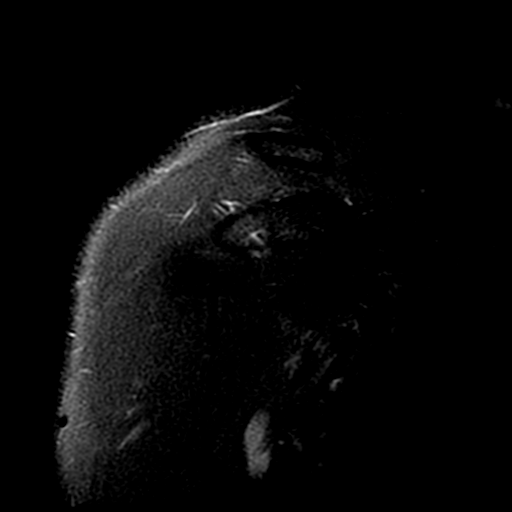

[Series 6: PD · oblique · 4.0mm · 0.29mm/px · 8 of 18 slices shown]
[im 1/18]
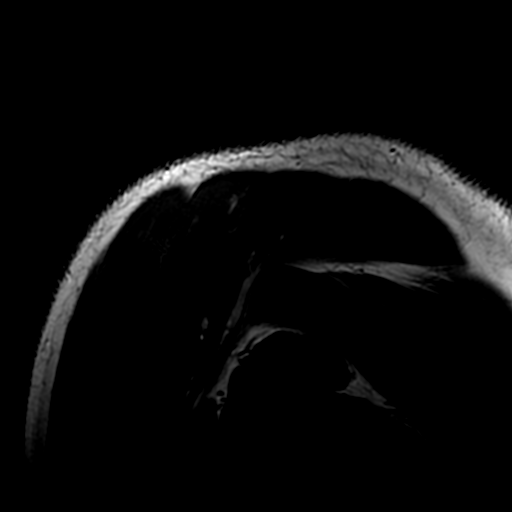
[im 3/18]
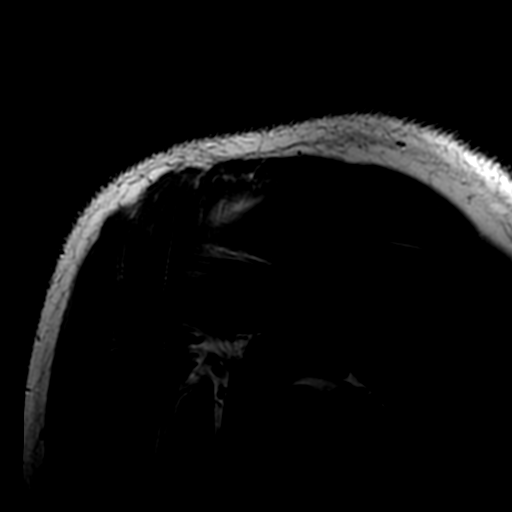
[im 5/18]
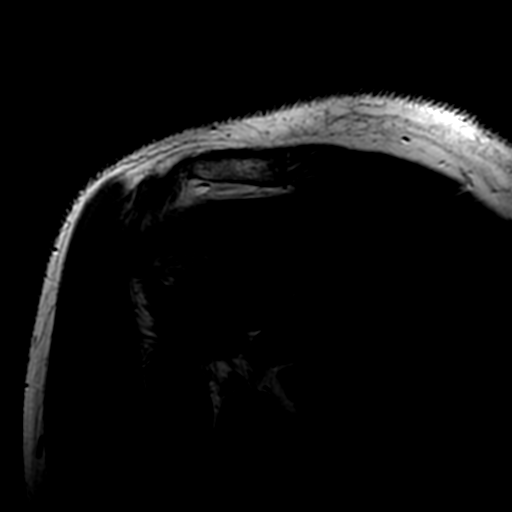
[im 8/18]
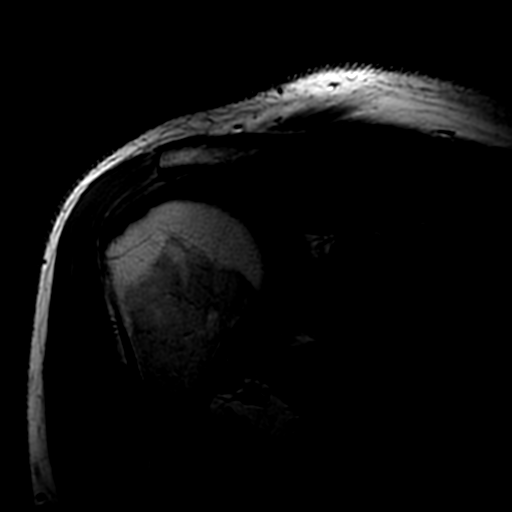
[im 10/18]
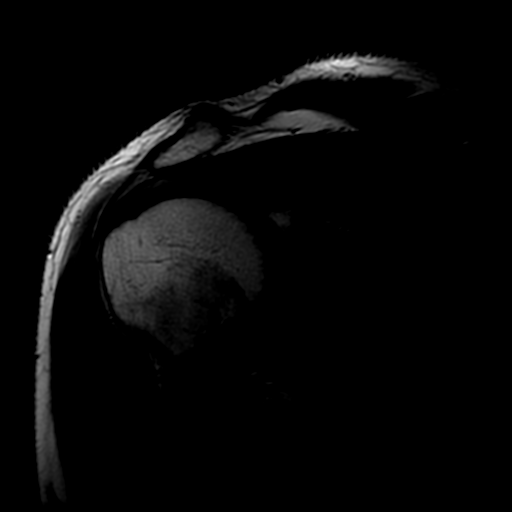
[im 13/18]
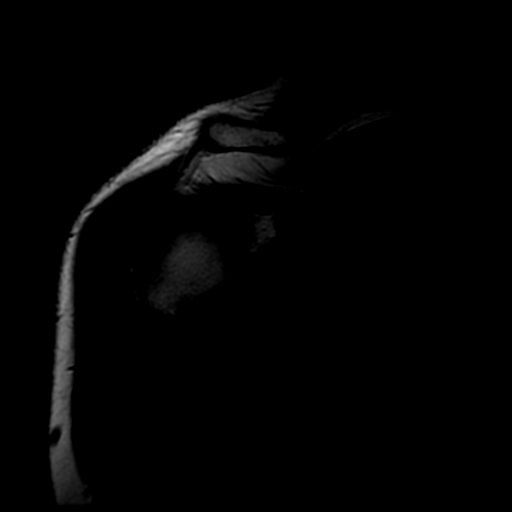
[im 15/18]
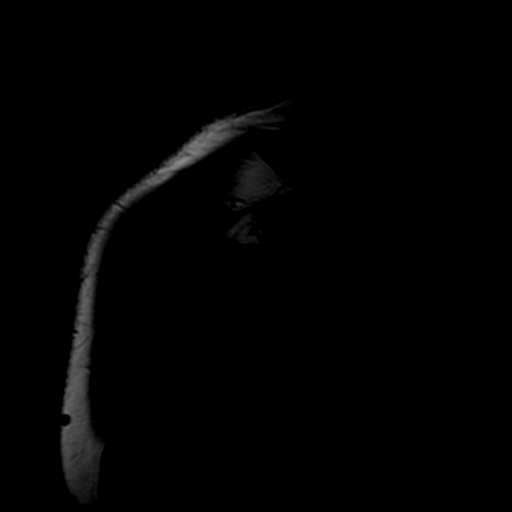
[im 18/18]
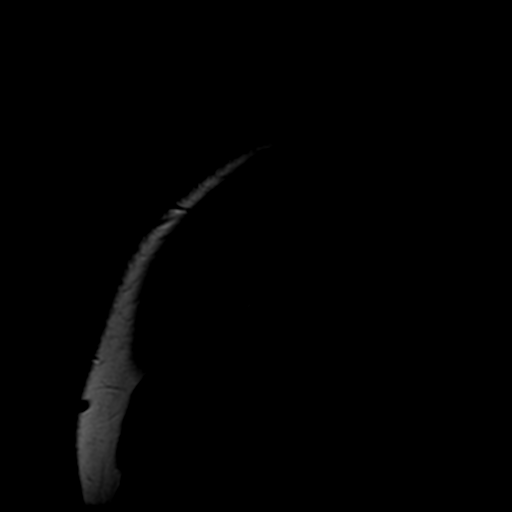

[Series 8: T2 fat-sat · oblique · 4.0mm · 0.31mm/px · 3 of 18 slices shown (3 of 3)]
[im 3/18]
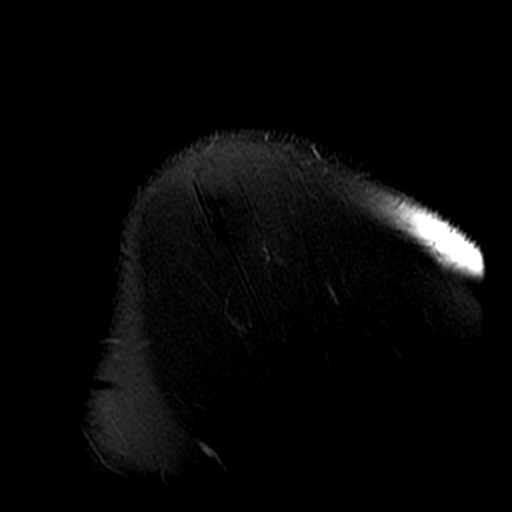
[im 10/18]
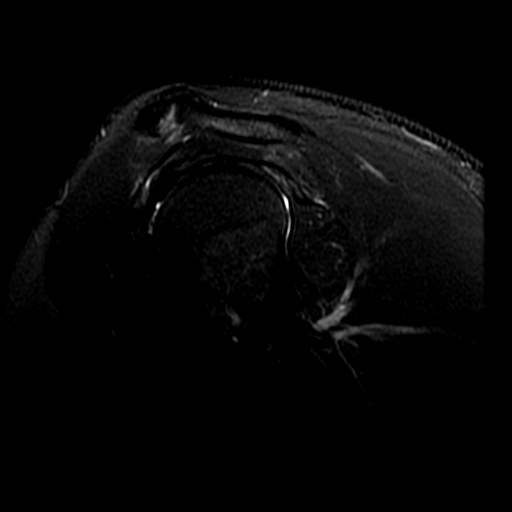
[im 15/18]
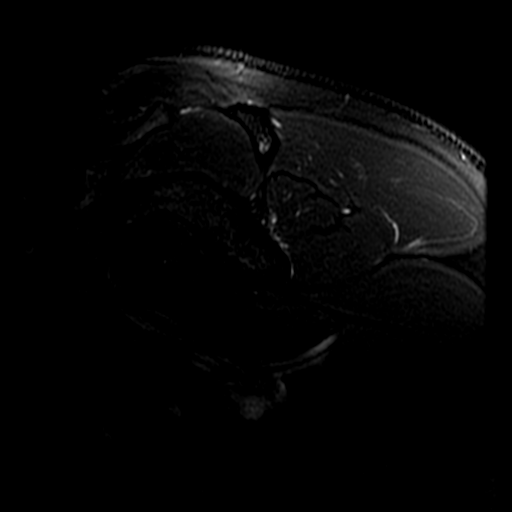

[19 of 40 positions shown; findings below may reference images not displayed]

FINDINGS: Rotator cuff:  Unremarkable

Muscles: Subtle edema along the myotendinous junction of the
infraspinatus muscle for example images 7 through 11 series 8,
potentially an indicator of a muscular strain.

Biceps long head:  Unremarkable

Acromioclavicular Joint: No significant arthropathy. Type I
acromion. No regional bursitis.

Glenohumeral Joint: Unremarkable

Labrum:  Grossly unremarkable

Bones: No significant extra-articular osseous abnormalities
identified.

Other: No supplemental non-categorized findings.
IMPRESSION: 1. Edema tracking in the infraspinatus muscle along the distal
myotendinous junction raising the possibility of muscle strain.
Otherwise no significant internal derangement to explain the
patient's continued shoulder pain.

## 2019-11-26 ENCOUNTER — Encounter: Payer: 59 | Admitting: Family Medicine

## 2019-11-29 ENCOUNTER — Other Ambulatory Visit: Payer: Self-pay

## 2019-12-02 ENCOUNTER — Other Ambulatory Visit: Payer: Self-pay

## 2019-12-02 ENCOUNTER — Encounter: Payer: Self-pay | Admitting: Family Medicine

## 2019-12-02 ENCOUNTER — Ambulatory Visit (INDEPENDENT_AMBULATORY_CARE_PROVIDER_SITE_OTHER): Payer: 59 | Admitting: Family Medicine

## 2019-12-02 VITALS — BP 112/80 | HR 90 | Temp 97.1°F | Ht 68.0 in | Wt 218.1 lb

## 2019-12-02 DIAGNOSIS — Z0000 Encounter for general adult medical examination without abnormal findings: Secondary | ICD-10-CM

## 2019-12-02 NOTE — Progress Notes (Signed)
Chief Complaint  Patient presents with  . Annual Exam    Discuss getting a Colonoscopy    Well Male Jimmy Barrett is here for a complete physical.   His last physical was >1 year ago.  Current diet: in general, a "healthy" diet.   Current exercise: wt resistance, cardio Weight trend: increased Fatigue? No. Seat belt? Yes.    Health maintenance Tetanus- Yes HIV- Yes  Past Medical History:  Diagnosis Date  . No known health problems      Past Surgical History:  Procedure Laterality Date  . OTHER SURGICAL HISTORY     Shrapnel removed from leg in [place]    Medications  Current Outpatient Medications on File Prior to Visit  Medication Sig Dispense Refill  . Ashwagandha 125 MG CAPS Take 1 capsule by mouth daily.    . Astaxanthin 4 MG CAPS Take 1 capsule by mouth daily.    . cholecalciferol (VITAMIN D3) 25 MCG (1000 UNIT) tablet Take 1,000 Units by mouth daily.    . Omega-3 Fatty Acids (FISH OIL) 1000 MG CAPS Take 1 capsule by mouth 2 (two) times daily.    . Probiotic Product (PROBIOTIC DAILY PO) Take 1 capsule by mouth daily.      Allergies No Known Allergies  Family History Family History  Problem Relation Age of Onset  . Cancer Father        passed away from colon Cancer    Review of Systems: Constitutional: no fevers or chills Eye:  no recent significant change in vision Ear/Nose/Mouth/Throat:  Ears:  no hearing loss Nose/Mouth/Throat:  no complaints of nasal congestion, no sore throat Cardiovascular:  no chest pain Respiratory:  no shortness of breath Gastrointestinal:  no abdominal pain, no change in bowel habits GU:  Male: negative for dysuria, frequency, and incontinence Musculoskeletal/Extremities:  no pain of the joints Integumentary (Skin/Breast):  no abnormal skin lesions reported Neurologic:  no headaches Endocrine: No unexpected weight changes Hematologic/Lymphatic:  no night sweats  Exam BP 112/80 (BP Location: Left Arm, Patient  Position: Sitting, Cuff Size: Normal)   Pulse 90   Temp (!) 97.1 F (36.2 C) (Temporal)   Ht 5\' 8"  (1.727 m)   Wt 218 lb 2 oz (98.9 kg)   SpO2 96%   BMI 33.17 kg/m  General:  well developed, well nourished, in no apparent distress Skin:  no significant moles, warts, or growths Head:  no masses, lesions, or tenderness Eyes:  pupils equal and round, sclera anicteric without injection Ears:  canals without lesions, TMs shiny without retraction, no obvious effusion, no erythema Nose:  nares patent, septum midline, mucosa normal Throat/Pharynx:  lips and gingiva without lesion; tongue and uvula midline; non-inflamed pharynx; no exudates or postnasal drainage Neck: neck supple without adenopathy, thyromegaly, or masses Lungs:  clear to auscultation, breath sounds equal bilaterally, no respiratory distress Cardio:  regular rate and rhythm, no bruits, no LE edema Abdomen:  abdomen soft, nontender; bowel sounds normal; no masses or organomegaly Rectal: Deferred Musculoskeletal:  symmetrical muscle groups noted without atrophy or deformity Extremities:  no clubbing, cyanosis, or edema, no deformities, no skin discoloration Neuro:  gait normal; deep tendon reflexes normal and symmetric Psych: well oriented with normal range of affect and appropriate judgment/insight  Assessment and Plan  Well adult exam - Plan: CBC, Comprehensive metabolic panel, Lipid panel   Well 34 y.o. male. Counseled on diet and exercise. Self testicular exams recommended at least monthly.  Other orders as above. CCS deferred to  future. Dad dx'd at 83, thought he might have had it for 10 years?  Follow up in 1 year pending the above workup. The patient voiced understanding and agreement to the plan.  [place], [place] 12/02/19 4:08 PM\

## 2019-12-02 NOTE — Patient Instructions (Addendum)
Give [place] 2-3 business days to get the results of your labs back.   Keep the diet clean and stay active.  Do monthly self testicular checks in the shower. You are feeling for lumps/bumps that don't belong. If you feel anything like this, let me know!  I don't think we need to pursue colonoscopy at this time. If you would like to see a specialist, let me know.  Let us know if you need anything.

## 2019-12-03 ENCOUNTER — Encounter: Payer: Self-pay | Admitting: Family Medicine

## 2019-12-03 ENCOUNTER — Other Ambulatory Visit: Payer: Self-pay | Admitting: Family Medicine

## 2019-12-03 DIAGNOSIS — D72829 Elevated white blood cell count, unspecified: Secondary | ICD-10-CM

## 2019-12-03 DIAGNOSIS — E7849 Other hyperlipidemia: Secondary | ICD-10-CM

## 2019-12-03 LAB — COMPREHENSIVE METABOLIC PANEL
ALT: 106 U/L — ABNORMAL HIGH (ref 0–53)
AST: 41 U/L — ABNORMAL HIGH (ref 0–37)
Albumin: 4.6 g/dL (ref 3.5–5.2)
Alkaline Phosphatase: 82 U/L (ref 39–117)
BUN: 12 mg/dL (ref 6–23)
CO2: 28 mEq/L (ref 19–32)
Calcium: 9.5 mg/dL (ref 8.4–10.5)
Chloride: 101 mEq/L (ref 96–112)
Creatinine, Ser: 0.95 mg/dL (ref 0.40–1.50)
GFR: 90.61 mL/min (ref >60.00)
Glucose, Bld: 94 mg/dL (ref 70–99)
Potassium: 4 mEq/L (ref 3.5–5.1)
Sodium: 137 mEq/L (ref 135–145)
Total Bilirubin: 0.9 mg/dL (ref 0.2–1.2)
Total Protein: 7.4 g/dL (ref 6.0–8.3)

## 2019-12-03 LAB — CBC
HCT: 45.5 % (ref 39.0–52.0)
Hemoglobin: 16.2 g/dL (ref 13.0–17.0)
MCHC: 35.6 g/dL (ref 30.0–36.0)
MCV: 89.3 fl (ref 78.0–100.0)
Platelets: 308 10*3/uL (ref 150.0–400.0)
RBC: 5.09 Mil/uL (ref 4.22–5.81)
RDW: 12.9 % (ref 11.5–15.5)
WBC: 13.4 10*3/uL — ABNORMAL HIGH (ref 4.0–10.5)

## 2019-12-03 LAB — LDL CHOLESTEROL, DIRECT: Direct LDL: 74 mg/dL

## 2019-12-03 LAB — LIPID PANEL
Cholesterol: 182 mg/dL (ref 0–200)
HDL: 29.9 mg/dL — ABNORMAL LOW (ref >39.00)
Total CHOL/HDL Ratio: 6
Triglycerides: 425 mg/dL — ABNORMAL HIGH (ref 0.0–149.0)

## 2019-12-31 ENCOUNTER — Other Ambulatory Visit: Payer: Self-pay

## 2019-12-31 ENCOUNTER — Other Ambulatory Visit (INDEPENDENT_AMBULATORY_CARE_PROVIDER_SITE_OTHER): Payer: 59

## 2019-12-31 DIAGNOSIS — D72829 Elevated white blood cell count, unspecified: Secondary | ICD-10-CM

## 2019-12-31 DIAGNOSIS — E7849 Other hyperlipidemia: Secondary | ICD-10-CM

## 2019-12-31 LAB — HEPATIC FUNCTION PANEL
ALT: 84 U/L — ABNORMAL HIGH (ref 0–53)
AST: 34 U/L (ref 0–37)
Albumin: 4.7 g/dL (ref 3.5–5.2)
Alkaline Phosphatase: 70 U/L (ref 39–117)
Bilirubin, Direct: 0.2 mg/dL (ref 0.0–0.3)
Total Bilirubin: 1.1 mg/dL (ref 0.2–1.2)
Total Protein: 6.9 g/dL (ref 6.0–8.3)

## 2019-12-31 LAB — CBC
HCT: 44.6 % (ref 39.0–52.0)
Hemoglobin: 15.5 g/dL (ref 13.0–17.0)
MCHC: 34.7 g/dL (ref 30.0–36.0)
MCV: 90 fl (ref 78.0–100.0)
Platelets: 318 10*3/uL (ref 150.0–400.0)
RBC: 4.95 Mil/uL (ref 4.22–5.81)
RDW: 12.2 % (ref 11.5–15.5)
WBC: 6.4 10*3/uL (ref 4.0–10.5)

## 2019-12-31 LAB — LIPID PANEL
Cholesterol: 173 mg/dL (ref 0–200)
HDL: 34 mg/dL — ABNORMAL LOW (ref >39.00)
NonHDL: 139.24
Total CHOL/HDL Ratio: 5
Triglycerides: 203 mg/dL — ABNORMAL HIGH (ref 0.0–149.0)
VLDL: 40.6 mg/dL — ABNORMAL HIGH (ref 0.0–40.0)

## 2019-12-31 LAB — LDL CHOLESTEROL, DIRECT: Direct LDL: 108 mg/dL

## 2020-02-11 ENCOUNTER — Ambulatory Visit: Payer: 59 | Admitting: Family Medicine

## 2020-02-11 ENCOUNTER — Other Ambulatory Visit: Payer: Self-pay

## 2020-02-11 ENCOUNTER — Encounter: Payer: Self-pay | Admitting: Family Medicine

## 2020-02-11 VITALS — BP 110/78 | HR 49 | Temp 97.4°F | Ht 67.0 in | Wt 207.0 lb

## 2020-02-11 DIAGNOSIS — M25562 Pain in left knee: Secondary | ICD-10-CM

## 2020-02-11 DIAGNOSIS — M545 Low back pain, unspecified: Secondary | ICD-10-CM

## 2020-02-11 DIAGNOSIS — G8929 Other chronic pain: Secondary | ICD-10-CM

## 2020-02-11 DIAGNOSIS — H9193 Unspecified hearing loss, bilateral: Secondary | ICD-10-CM | POA: Diagnosis not present

## 2020-02-11 DIAGNOSIS — H9313 Tinnitus, bilateral: Secondary | ICD-10-CM

## 2020-02-11 DIAGNOSIS — M25561 Pain in right knee: Secondary | ICD-10-CM
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
## 2020-02-11 NOTE — Patient Instructions (Signed)
If you do not hear anything about your referrals in the next 1-2 weeks, call our office and ask for an update.  We will be in touch regarding your X-rays.  Let us know if you need anything.  Knee Exercises It is normal to feel mild stretching, pulling, tightness, or discomfort as you do these exercises, but you should stop right away if you feel sudden pain or your pain gets worse. STRETCHING AND RANGE OF MOTION EXERCISES  These exercises warm up your muscles and joints and improve the movement and flexibility of your knee. These exercises also help to relieve pain, numbness, and tingling. Exercise A: Knee Extension, Prone  1. Lie on your abdomen on a bed. 2. Place your left / right knee just beyond the edge of the surface so your knee is not on the bed. You can put a towel under your left / right thigh just above your knee for comfort. 3. Relax your leg muscles and allow gravity to straighten your knee. You should feel a stretch behind your left / right knee. 4. Hold this position for 30 seconds. 5. Scoot up so your knee is supported between repetitions. Repeat 2 times. Complete this stretch 3 times per week. Exercise B: Knee Flexion, Active    1. Lie on your back with both knees straight. If this causes back discomfort, bend your left / right knee so your foot is flat on the floor. 2. Slowly slide your left / right heel back toward your buttocks until you feel a gentle stretch in the front of your knee or thigh. 3. Hold this position for 30 seconds. 4. Slowly slide your left / right heel back to the starting position. Repeat 2 times. Complete this exercise 3 times per week. Exercise C: Quadriceps, Prone    1. Lie on your abdomen on a firm surface, such as a bed or padded floor. 2. Bend your left / right knee and hold your ankle. If you cannot reach your ankle or pant leg, loop a belt around your foot and grab the belt instead. 3. Gently pull your heel toward your buttocks. Your  knee should not slide out to the side. You should feel a stretch in the front of your thigh and knee. 4. Hold this position for 30 seconds. Repeat 2 times. Complete this stretch 3 times per week. Exercise D: Hamstring, Supine  1. Lie on your back. 2. Loop a belt or towel over the ball of your left / right foot. The ball of your foot is on the walking surface, right under your toes. 3. Straighten your left / right knee and slowly pull on the belt to raise your leg until you feel a gentle stretch behind your knee. ? Do not let your left / right knee bend while you do this. ? Keep your other leg flat on the floor. 4. Hold this position for 30 seconds. Repeat 2 times. Complete this stretch 3 times per week. STRENGTHENING EXERCISES  These exercises build strength and endurance in your knee. Endurance is the ability to use your muscles for a long time, even after they get tired. Exercise E: Quadriceps, Isometric    1. Lie on your back with your left / right leg extended and your other knee bent. Put a rolled towel or small pillow under your knee if told by your health care provider. 2. Slowly tense the muscles in the front of your left / right thigh. You should see your kneecap slide  up toward your hip or see increased dimpling just above the knee. This motion will push the back of the knee toward the floor. 3. For 3 seconds, keep the muscle as tight as you can without increasing your pain. 4. Relax the muscles slowly and completely. Repeat for 10 total reps Repeat 2 ti mes. Complete this exercise 3 times per week. Exercise F: Straight Leg Raises - Quadriceps  1. Lie on your back with your left / right leg extended and your other knee bent. 2. Tense the muscles in the front of your left / right thigh. You should see your kneecap slide up or see increased dimpling just above the knee. Your thigh may even shake a bit. 3. Keep these muscles tight as you raise your leg 4-6 inches (10-15 cm) off the  floor. Do not let your knee bend. 4. Hold this position for 3 seconds. 5. Keep these muscles tense as you lower your leg. 6. Relax your muscles slowly and completely after each repetition. 10 total reps. Repeat 2 times. Complete this exercise 3 times per week.  Exercise G: Hamstring Curls    If told by your health care provider, do this exercise while wearing ankle weights. Begin with 5 lb weights (optional). Then increase the weight by 1 lb (0.5 kg) increments. Do not wear ankle weights that are more than 20 lbs to start with. 1. Lie on your abdomen with your legs straight. 2. Bend your left / right knee as far as you can without feeling pain. Keep your hips flat against the floor. 3. Hold this position for 3 seconds. 4. Slowly lower your leg to the starting position. Repeat for 10 reps.  Repeat 2 times. Complete this exercise 3 times per week. Exercise H: Squats (Quadriceps)  1. Stand in front of a table, with your feet and knees pointing straight ahead. You may rest your hands on the table for balance but not for support. 2. Slowly bend your knees and lower your hips like you are going to sit in a chair. ? Keep your weight over your heels, not over your toes. ? Keep your lower legs upright so they are parallel with the table legs. ? Do not let your hips go lower than your knees. ? Do not bend lower than told by your health care provider. ? If your knee pain increases, do not bend as low. 3. Hold the squat position for 1 second. 4. Slowly push with your legs to return to standing. Do not use your hands to pull yourself to standing. Repeat 2 times. Complete this exercise 3 times per week. Exercise I: Wall Slides (Quadriceps)    1. Lean your back against a smooth wall or door while you walk your feet out 18-24 inches (46-61 cm) from it. 2. Place your feet hip-width apart. 3. Slowly slide down the wall or door until your knees Repeat 2 times. Complete this exercise every other  day. 4. Exercise K: Straight Leg Raises - Hip Abductors  1. Lie on your side with your left / right leg in the top position. Lie so your head, shoulder, knee, and hip line up. You may bend your bottom knee to help you keep your balance. 2. Roll your hips slightly forward so your hips are stacked directly over each other and your left / right knee is facing forward. 3. Leading with your heel, lift your top leg 4-6 inches (10-15 cm). You should feel the muscles in your outer hip  lifting. ? Do not let your foot drift forward. ? Do not let your knee roll toward the ceiling. 4. Hold this position for 3 seconds. 5. Slowly return your leg to the starting position. 6. Let your muscles relax completely after each repetition. 10 total reps. Repeat 2 times. Complete this exercise 3 times per week. Exercise J: Straight Leg Raises - Hip Extensors  1. Lie on your abdomen on a firm surface. You can put a pillow under your hips if that is more comfortable. 2. Tense the muscles in your buttocks and lift your left / right leg about 4-6 inches (10-15 cm). Keep your knee straight as you lift your leg. 3. Hold this position for 3 seconds. 4. Slowly lower your leg to the starting position. 5. Let your leg relax completely after each repetition. Repeat 2 times. Complete this exercise 3 times per week. Document Released: 07/13/2005 Document Revised: 05/23/2016 Document Reviewed: 07/05/2015 Elsevier Interactive Patient Education  2017 [employer].

## 2020-02-13 ENCOUNTER — Ambulatory Visit (INDEPENDENT_AMBULATORY_CARE_PROVIDER_SITE_OTHER)
Admission: RE | Admit: 2020-02-13 | Discharge: 2020-02-13 | Disposition: A | Discharge status: Home / Self Care | Payer: 59 | Source: Ambulatory Visit | Attending: Family Medicine | Admitting: Family Medicine

## 2020-02-13 ENCOUNTER — Other Ambulatory Visit: Payer: Self-pay

## 2020-02-13 DIAGNOSIS — M25561 Pain in right knee: Secondary | ICD-10-CM | POA: Diagnosis not present

## 2020-02-13 DIAGNOSIS — G8929 Other chronic pain: Secondary | ICD-10-CM

## 2020-02-13 DIAGNOSIS — M545 Low back pain: Secondary | ICD-10-CM | POA: Diagnosis not present

## 2020-02-13 DIAGNOSIS — M25562 Pain in left knee: Secondary | ICD-10-CM

## 2020-02-24 ENCOUNTER — Encounter: Payer: Self-pay | Admitting: Family Medicine

## 2020-02-24 ENCOUNTER — Ambulatory Visit: Payer: Self-pay

## 2020-02-24 ENCOUNTER — Ambulatory Visit (INDEPENDENT_AMBULATORY_CARE_PROVIDER_SITE_OTHER): Payer: 59 | Admitting: Family Medicine

## 2020-02-24 ENCOUNTER — Other Ambulatory Visit: Payer: Self-pay

## 2020-02-24 VITALS — BP 138/90 | HR 84 | Ht 68.0 in | Wt 207.0 lb

## 2020-02-24 DIAGNOSIS — M25561 Pain in right knee: Secondary | ICD-10-CM | POA: Diagnosis not present

## 2020-02-24 DIAGNOSIS — M545 Low back pain, unspecified: Secondary | ICD-10-CM | POA: Insufficient documentation

## 2020-02-24 DIAGNOSIS — G8929 Other chronic pain: Secondary | ICD-10-CM

## 2020-02-24 DIAGNOSIS — M25562 Pain in left knee: Secondary | ICD-10-CM

## 2020-02-24 MED ORDER — PENNSAID 2 % EX SOLN
1.0000 | Freq: Two times a day (BID) | CUTANEOUS | 3 refills | Status: DC
Start: 2020-02-24 — End: 2021-12-03

## 2020-02-24 MED ORDER — CYCLOBENZAPRINE HCL 10 MG PO TABS
ORAL_TABLET | ORAL | 1 refills | Status: DC
Start: 2020-02-24 — End: 2021-12-03

## 2020-02-24 MED FILL — CYCLOBENZAPRINE HCL 10 MG T: 10 | 10 days supply | Qty: 30 | Fill #0

## 2020-02-24 NOTE — Assessment & Plan Note (Signed)
Acute on chronic in nature.  Pain is been ongoing since when he served in [employer].  Has had physical therapy previously.  Has some locking sensation from time to time. -Counseled on home exercise therapy and supportive care. -[place]. -MRI of the right and left knee to evaluate for any chondral lesions or meniscal abnormalities.
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
## 2020-02-24 NOTE — Assessment & Plan Note (Signed)
Acute on chronic in nature.  Has some loss of lordosis on imaging.  May be chronic spasm related.  No radicular component. -Counseled on home exercise therapy and supportive care. - Flexeril. -Could consider physical therapy.

## 2020-02-24 NOTE — Patient Instructions (Signed)
Nice to meet you Please try the exercises  Please use the muscle relaxer as needed. It may make you sleepy.  Please try ice as needed for the knees  Please try heat for the back.  Please send me a message in MyChart with any questions or updates.  We will set up a virtual visit once the MRIs are resulted.    --Dr. Jordan Likes

## 2020-02-27 ENCOUNTER — Encounter: Payer: Self-pay | Admitting: Family Medicine

## 2020-03-02 ENCOUNTER — Ambulatory Visit: Payer: 59 | Admitting: Physical Therapy

## 2020-03-26 MED FILL — CYCLOBENZAPRINE HCL 10 MG T: 10 | 10 days supply | Qty: 30 | Fill #1

## 2020-03-27 ENCOUNTER — Other Ambulatory Visit: Payer: Self-pay | Admitting: Family Medicine

## 2020-03-27 DIAGNOSIS — E7849 Other hyperlipidemia: Secondary | ICD-10-CM

## 2020-03-27 NOTE — Progress Notes (Signed)
l °

## 2020-03-30 ENCOUNTER — Other Ambulatory Visit (INDEPENDENT_AMBULATORY_CARE_PROVIDER_SITE_OTHER): Payer: 59

## 2020-03-30 ENCOUNTER — Other Ambulatory Visit: Payer: Self-pay

## 2020-03-30 ENCOUNTER — Ambulatory Visit
Admission: RE | Admit: 2020-03-30 | Discharge: 2020-03-30 | Disposition: A | Discharge status: Home / Self Care | Payer: 59 | Source: Ambulatory Visit | Attending: Family Medicine | Admitting: Family Medicine

## 2020-03-30 DIAGNOSIS — E7849 Other hyperlipidemia: Secondary | ICD-10-CM

## 2020-03-30 DIAGNOSIS — G8929 Other chronic pain: Secondary | ICD-10-CM

## 2020-03-30 DIAGNOSIS — M25561 Pain in right knee: Secondary | ICD-10-CM | POA: Diagnosis not present

## 2020-03-30 DIAGNOSIS — M25562 Pain in left knee: Secondary | ICD-10-CM | POA: Diagnosis not present

## 2020-03-30 LAB — HEPATIC FUNCTION PANEL
ALT: 52 U/L (ref 0–53)
AST: 23 U/L (ref 0–37)
Albumin: 4.6 g/dL (ref 3.5–5.2)
Alkaline Phosphatase: 67 U/L (ref 39–117)
Bilirubin, Direct: 0.1 mg/dL (ref 0.0–0.3)
Total Bilirubin: 0.9 mg/dL (ref 0.2–1.2)
Total Protein: 7.2 g/dL (ref 6.0–8.3)

## 2020-03-30 LAB — LIPID PANEL
Cholesterol: 175 mg/dL (ref 0–200)
HDL: 30.1 mg/dL — ABNORMAL LOW (ref >39.00)
NonHDL: 144.47
Total CHOL/HDL Ratio: 6
Triglycerides: 365 mg/dL — ABNORMAL HIGH (ref 0.0–149.0)
VLDL: 73 mg/dL — ABNORMAL HIGH (ref 0.0–40.0)

## 2020-03-30 LAB — LDL CHOLESTEROL, DIRECT: Direct LDL: 85 mg/dL

## 2020-04-14 ENCOUNTER — Encounter: Payer: Self-pay | Admitting: Family Medicine

## 2020-04-14 ENCOUNTER — Other Ambulatory Visit: Payer: Self-pay | Admitting: Family Medicine

## 2020-04-14 ENCOUNTER — Telehealth (INDEPENDENT_AMBULATORY_CARE_PROVIDER_SITE_OTHER): Payer: 59 | Admitting: Family Medicine

## 2020-04-14 ENCOUNTER — Other Ambulatory Visit: Payer: Self-pay

## 2020-04-14 DIAGNOSIS — E7849 Other hyperlipidemia: Secondary | ICD-10-CM

## 2020-04-14 DIAGNOSIS — M25562 Pain in left knee: Secondary | ICD-10-CM

## 2020-04-14 DIAGNOSIS — M25561 Pain in right knee: Secondary | ICD-10-CM

## 2020-04-14 DIAGNOSIS — G8929 Other chronic pain: Secondary | ICD-10-CM

## 2020-04-14 MED ORDER — ROSUVASTATIN CALCIUM 20 MG PO TABS
20.0000 mg | ORAL_TABLET | Freq: Every day | ORAL | 3 refills | Status: DC
Start: 2020-04-14 — End: 2020-04-14

## 2020-04-14 MED FILL — ROSUVASTATIN CALCIUM 20 MG: 20 | 90 days supply | Qty: 90 | Fill #0

## 2020-04-14 NOTE — Assessment & Plan Note (Signed)
MRI was reassuring with no structural abnormalities. He may have component of patellofemoral pain as his pain is still ongoing and having burning sensation. -Counseled on home exercise therapy and supportive care. -Can consider physical therapy.

## 2020-04-14 NOTE — Progress Notes (Signed)
Virtual Visit via Telephone Note  I connected with Jimmy Barrett on 04/14/20 at  8:10 AM EDT by telephone and verified that I am speaking with the correct person using two identifiers.   I discussed the limitations, risks, security and privacy concerns of performing an evaluation and management service by telephone and the availability of in person appointments. I also discussed with the patient that there may be a patient responsible charge related to this service. The patient expressed understanding and agreed to proceed.  Patient: home Physician: office.   History of Present Illness:  Jimmy Barrett is a 34 year old male that is following up after an MRI of his Barrett and left knee. These were found to be normal with no internal derangement.   Observations/Objective:   Assessment and Plan:  Chronic pain of both knees: MRI was reassuring with no structural abnormalities. He may have component of patellofemoral pain as his pain is still ongoing and having burning sensation. -Counseled on home exercise therapy and supportive care. -Can consider physical therapy.  Follow Up Instructions:    I discussed the assessment and treatment plan with the patient. The patient was provided an opportunity to ask questions and all were answered. The patient agreed with the plan and demonstrated an understanding of the instructions.   The patient was advised to call back or seek an in-person evaluation if the symptoms worsen or if the condition fails to improve as anticipated.  I provided 5 minutes of non-face-to-face time during this encounter.   Clare Gandy, MD

## 2020-04-29 MED FILL — ROSUVASTATIN CALCIUM 20 MG: 20 | 90 days supply | Qty: 90 | Fill #0

## 2020-08-17 MED FILL — ROSUVASTATIN CALCIUM 20 MG: 20 | 90 days supply | Qty: 90 | Fill #1

## 2020-08-18 ENCOUNTER — Other Ambulatory Visit: Payer: Self-pay | Admitting: Family Medicine

## 2020-08-20 ENCOUNTER — Other Ambulatory Visit: Payer: Self-pay | Admitting: Family Medicine

## 2020-08-20 ENCOUNTER — Other Ambulatory Visit (INDEPENDENT_AMBULATORY_CARE_PROVIDER_SITE_OTHER): Payer: 59

## 2020-08-20 ENCOUNTER — Other Ambulatory Visit: Payer: Self-pay

## 2020-08-20 DIAGNOSIS — E7849 Other hyperlipidemia: Secondary | ICD-10-CM

## 2020-08-20 LAB — HEPATIC FUNCTION PANEL
ALT: 43 U/L (ref 0–53)
AST: 20 U/L (ref 0–37)
Albumin: 4.5 g/dL (ref 3.5–5.2)
Alkaline Phosphatase: 70 U/L (ref 39–117)
Bilirubin, Direct: 0.1 mg/dL (ref 0.0–0.3)
Total Bilirubin: 0.7 mg/dL (ref 0.2–1.2)
Total Protein: 7.1 g/dL (ref 6.0–8.3)

## 2020-08-20 LAB — LIPID PANEL
Cholesterol: 120 mg/dL (ref 0–200)
HDL: 33.3 mg/dL — ABNORMAL LOW (ref >39.00)
NonHDL: 86.76
Total CHOL/HDL Ratio: 4
Triglycerides: 240 mg/dL — ABNORMAL HIGH (ref 0.0–149.0)
VLDL: 48 mg/dL — ABNORMAL HIGH (ref 0.0–40.0)

## 2020-08-20 LAB — LDL CHOLESTEROL, DIRECT: Direct LDL: 60 mg/dL

## 2020-08-20 MED ORDER — FENOFIBRATE 48 MG PO TABS
48.0000 mg | ORAL_TABLET | Freq: Every day | ORAL | 3 refills | Status: DC
Start: 2020-08-20 — End: 2020-10-19

## 2020-08-20 MED FILL — FENOFIBRATE 48 MG TABS: 48 | 30 days supply | Qty: 30 | Fill #0

## 2020-08-21 ENCOUNTER — Other Ambulatory Visit: Payer: Self-pay | Admitting: Family Medicine

## 2020-08-21 DIAGNOSIS — E7849 Other hyperlipidemia: Secondary | ICD-10-CM

## 2020-08-31 MED FILL — FENOFIBRATE 48 MG TABS: 48 | 30 days supply | Qty: 30 | Fill #0

## 2020-10-15 MED FILL — FENOFIBRATE 48 MG TABS: 48 | 30 days supply | Qty: 30 | Fill #1

## 2020-10-16 ENCOUNTER — Other Ambulatory Visit (INDEPENDENT_AMBULATORY_CARE_PROVIDER_SITE_OTHER): Payer: 59

## 2020-10-16 ENCOUNTER — Other Ambulatory Visit: Payer: Self-pay

## 2020-10-16 DIAGNOSIS — E7849 Other hyperlipidemia: Secondary | ICD-10-CM | POA: Diagnosis not present

## 2020-10-16 NOTE — Addendum Note (Signed)
Addended by: Rosita Kea on: 10/16/2020 02:23 PM   Modules accepted: Orders

## 2020-10-17 LAB — LIPID PANEL
Cholesterol: 115 mg/dL (ref <200)
HDL: 33 mg/dL — ABNORMAL LOW (ref >=40)
LDL Cholesterol (Calc): 53 mg/dL (calc)
Non-HDL Cholesterol (Calc): 82 mg/dL (calc) (ref <130)
Total CHOL/HDL Ratio: 3.5 (calc) (ref <5.0)
Triglycerides: 231 mg/dL — ABNORMAL HIGH (ref <150)

## 2020-10-19 ENCOUNTER — Other Ambulatory Visit: Payer: Self-pay | Admitting: Family Medicine

## 2020-10-19 DIAGNOSIS — E7849 Other hyperlipidemia: Secondary | ICD-10-CM

## 2020-10-19 MED ORDER — FENOFIBRATE 145 MG PO TABS: 145.0000 mg | ORAL_TABLET | Freq: Every day | ORAL | 3 refills | Status: DC

## 2020-10-19 MED FILL — FENOFIBRATE 145 MG TABS: 145 | 30 days supply | Qty: 30 | Fill #0

## 2020-11-20 MED FILL — ROSUVASTATIN CALCIUM 20 MG: 20 | 90 days supply | Qty: 90 | Fill #2

## 2020-11-20 MED FILL — FENOFIBRATE 145 MG TABS: 145 | 30 days supply | Qty: 30 | Fill #1

## 2020-12-01 ENCOUNTER — Other Ambulatory Visit: Payer: 59

## 2020-12-02 ENCOUNTER — Ambulatory Visit (INDEPENDENT_AMBULATORY_CARE_PROVIDER_SITE_OTHER): Payer: 59 | Admitting: Family Medicine

## 2020-12-02 ENCOUNTER — Other Ambulatory Visit: Payer: Self-pay

## 2020-12-02 ENCOUNTER — Encounter: Payer: Self-pay | Admitting: Family Medicine

## 2020-12-02 VITALS — BP 110/68 | HR 77 | Temp 98.3°F | Ht 68.5 in | Wt 225.0 lb

## 2020-12-02 DIAGNOSIS — Z0000 Encounter for general adult medical examination without abnormal findings: Secondary | ICD-10-CM | POA: Diagnosis not present

## 2020-12-02 DIAGNOSIS — Z1159 Encounter for screening for other viral diseases: Secondary | ICD-10-CM

## 2020-12-02 NOTE — Progress Notes (Signed)
Chief Complaint  Patient presents with  . Annual Exam    Well Male Jimmy Barrett is here for a complete physical.   His last physical was >1 year ago.  Current diet: in general, diet could be better.   Current exercise: walking; active with handyman work Weight trend: stable Fatigue out of ordinary? No. Seat belt? Yes.   Loss of interested in doing things or depression in past 2 weeks? No  Health maintenance Tetanus- Yes HIV- Yes Hep C- No  Past Medical History:  Diagnosis Date  . No known health problems      Past Surgical History:  Procedure Laterality Date  . OTHER SURGICAL HISTORY     Shrapnel removed from leg in [place]    Medications  Current Outpatient Medications on File Prior to Visit  Medication Sig Dispense Refill  . Cinnamon 500 MG capsule Take 500 mg by mouth daily.    . cyclobenzaprine (FLEXERIL) 10 MG tablet One half tab PO qHS, then increase gradually to one tab TID. 30 tablet 1  . Diclofenac Sodium (PENNSAID) 2 % SOLN Place 1 application onto the skin 2 (two) times daily. 112 g 3  . fenofibrate (TRICOR) 145 MG tablet Take 1 tablet (145 mg total) by mouth daily. 30 tablet 3  . L-Tyrosine 500 MG CAPS Take 1 capsule by mouth daily.    . rosuvastatin (CRESTOR) 20 MG tablet Take 1 tablet (20 mg total) by mouth daily. 90 tablet 3  . Turmeric Curcumin 500 MG CAPS Take 1 capsule by mouth daily.      Allergies No Known Allergies  Family History Family History  Problem Relation Age of Onset  . Cancer Father        passed away from colon Cancer    Review of Systems: Constitutional: no fevers or chills Eye:  no recent significant change in vision Ear/Nose/Mouth/Throat:  Ears:  no hearing loss Nose/Mouth/Throat:  no complaints of nasal congestion, no sore throat Cardiovascular:  no chest pain Respiratory:  no shortness of breath Gastrointestinal:  no abdominal pain, no change in bowel habits GU:  Male: negative for  dysuria Musculoskeletal/Extremities:  no pain of the joints Integumentary (Skin/Breast):  no abnormal skin lesions reported Neurologic:  no headaches Endocrine: No unexpected weight changes Hematologic/Lymphatic:  no night sweats  Exam BP 110/68 (BP Location: Left Arm, Patient Position: Sitting, Cuff Size: Normal)   Pulse 77   Temp 98.3 F (36.8 C) (Oral)   Ht 5' 8.5" (1.74 m)   Wt 225 lb (102.1 kg)   SpO2 97%   BMI 33.71 kg/m  General:  well developed, well nourished, in no apparent distress Skin:  no significant moles, warts, or growths Head:  no masses, lesions, or tenderness Eyes:  pupils equal and round, sclera anicteric without injection Ears:  canals without lesions, TMs shiny without retraction, no obvious effusion, no erythema Nose:  nares patent, septum midline, mucosa normal Throat/Pharynx:  lips and gingiva without lesion; tongue and uvula midline; non-inflamed pharynx; no exudates or postnasal drainage Neck: neck supple without adenopathy, thyromegaly, or masses Lungs:  clear to auscultation, breath sounds equal bilaterally, no respiratory distress Cardio:  regular rate and rhythm, no bruits, no LE edema Abdomen:  abdomen soft, nontender; bowel sounds normal; no masses or organomegaly Genital (male): Deferred Rectal: Deferred Musculoskeletal:  symmetrical muscle groups noted without atrophy or deformity Extremities:  no clubbing, cyanosis, or edema, no deformities, no skin discoloration Neuro:  gait normal; deep tendon reflexes normal and  symmetric Psych: well oriented with normal range of affect and appropriate judgment/insight  Assessment and Plan  Well adult exam - Plan: Comprehensive metabolic panel, Lipid panel  Encounter for hepatitis C screening test for low risk patient - Plan: Hepatitis C antibody   Well 35 y.o. male. Counseled on diet and exercise. Self testicular exams recommended at least monthly.  Other orders as above. Follow up in 1 year  pending the above workup. The patient voiced understanding and agreement to the plan.  Jilda Roche [place], DO 12/02/20 3:16 PM

## 2020-12-02 NOTE — Patient Instructions (Signed)
Give us 2-3 business days to get the results of your labs back.   Keep the diet clean and stay active.  Do monthly self testicular checks in the shower. You are feeling for lumps/bumps that don't belong. If you feel anything like this, let me know!  Let us know if you need anything. 

## 2020-12-03 LAB — COMPREHENSIVE METABOLIC PANEL
ALT: 72 U/L — ABNORMAL HIGH (ref 0–53)
AST: 33 U/L (ref 0–37)
Albumin: 4.7 g/dL (ref 3.5–5.2)
Alkaline Phosphatase: 59 U/L (ref 39–117)
BUN: 15 mg/dL (ref 6–23)
CO2: 26 mEq/L (ref 19–32)
Calcium: 9.6 mg/dL (ref 8.4–10.5)
Chloride: 103 mEq/L (ref 96–112)
Creatinine, Ser: 0.95 mg/dL (ref 0.40–1.50)
GFR: 103.88 mL/min (ref >60.00)
Glucose, Bld: 92 mg/dL (ref 70–99)
Potassium: 4.3 mEq/L (ref 3.5–5.1)
Sodium: 139 mEq/L (ref 135–145)
Total Bilirubin: 0.7 mg/dL (ref 0.2–1.2)
Total Protein: 7.3 g/dL (ref 6.0–8.3)

## 2020-12-03 LAB — LIPID PANEL
Cholesterol: 172 mg/dL (ref 0–200)
HDL: 31.1 mg/dL — ABNORMAL LOW (ref >39.00)
NonHDL: 141.14
Total CHOL/HDL Ratio: 6
Triglycerides: 253 mg/dL — ABNORMAL HIGH (ref 0.0–149.0)
VLDL: 50.6 mg/dL — ABNORMAL HIGH (ref 0.0–40.0)

## 2020-12-03 LAB — HEPATITIS C ANTIBODY
Hepatitis C Ab: NONREACTIVE
SIGNAL TO CUT-OFF: 0.01 (ref <1.00)

## 2020-12-03 LAB — LDL CHOLESTEROL, DIRECT: Direct LDL: 107 mg/dL

## 2020-12-07 NOTE — Progress Notes (Signed)
* * *        **Vincent Oconnor**    --- ---    56 Y old Male, DOB: 26-Mar-1986    Account Number: [id number]    [address] , [place], [place]    Home: [phone]    Guarantor: Vincent Oconnor Insurance: [employer] Payer ID: [id number]    Appointment Facility: [facility] [place]        * * *    05/10/2016  Progress Notes: Gaye Alken    --- ---    ---        Current Medications    ---      None    ---      Past Medical History    ---       PTSD.        ---      Surgical History    ---      Denies Past Surgical History    ---      Family History    ---      Father: alive 33 yrs    ---    Mother: alive 44 yrs    ---    2 brother(s) , 1 sister(s) .    ---    Brother deceased of overdose.    ---      Social History    ---     Violeta Gelinas 2017:_    Previsit Prep discussed with care team/Quality Folder Updated Last updated on:  05/10/2016. Self referrals since last visit \--> No. Hospital/ED/SNF stay  since last visit \--> No. BMI Follow-Up Weight Assessment Findings:  Overweight, BMI management provided Yes, Above Normal BMI Follow-up Dietary  management education, guidance, and counseling. Depression Screening (PHQ2)  Little interest or pleasure in doing things? No, Feeling down, depressed or  hopeless? Yes. Depression Screening (PHQ9) Little interest or pleasure in  doing things More than half the days, Feeling down, depressed, or hopeless Not  at all, Trouble falling or staying asleep, or sleeping too much More than half  the days, Feeling tired or having little energy Several days, Poor appetite or  overeating Not at all, Feeling bad about yourself, or that you are a failure,  or have let yourself or your family down Not at all, Trouble concentrating on  things, such as reading the newspaper or watching television Several days,  Moving or speaking so slowly that other people could have noticed. Or the  opposite being so fidgety or restless that you have been moving around a lot  more  than usual Not at all, Thoughts that you would be better off dead, or of  hurting yourself in some way Not at all, Total Score 6, Interpretation Mild  Depression. Smoking Are you a: nonsmoker.    _Family, Social and Culture:_    Alcohol Screening Did you have a drink containing alcohol in the past year?  Yes, How often did you have a drink containing alcohol in the past year? Two  to four times a month (2 points), How many drinks did you have on a typucal  day when you were drinking in the past year? 1 or 2 (0 points), How often did  you have six or more drinks on one occasion in the past year? Never (0  points), Points 2\. Communication Limitations: None. Education \--> finished  college. Exercise Frequency: Occasionally. Housing Number of adults in  household: 1\. Marital Status \--> single. Nutrition Diet no special  diet.  Occupation Employment Status: Employed, --> Production designer, theatre/television/film.    _Behavioral Health:_    Family History Family History Of Substance Abuse: No, Family History of Mental  Health: No.    _Sexual History:_    Sexual History Had sex in the past 12 months (vaginal, oral, or anal)? Yes,  with Women only.      Allergies    ---      N.K.D.A.    ---      Hospitalization/Major Diagnostic Procedure    ---      Denies Past Hospitalization    ---      Review of Systems    ---     _ANNUAL_ROS_ :    CONST: NEGATIVE FOR:, Fevers, Chills, Fatigue. EYES: NEGATIVE FOR: , Vision  changes, Eye Pain. EARS/NOSE/THROAT NEGATIVE FOR:, hearing loss , nasal  stuffiness. CV: NEGATIVE FOR:, Chest Pain, Palpitations . RESPIRATORY:  NEGATIVE FOR: , Cough, Excessive snoring, Wheeze. GI: NEGATIVE FOR: ,  abdominal pain , nausea, vomitting. NEURO: NEGATIVE FOR: , HA, Dizziness.            Reason for Appointment    ---      1\. Needs new pcp    ---      History of Present Illness    ---     _-HPI-_ :    Pt is here for establishment. Pt is not smoking. Pt lost his brother in April  and has been dealing with grief and would like to see  Psych. pt had hx of  PTSD. Pt has athlete foot and pt did take pill long time ago and would like to  try again.      Vital Signs    ---    BP sitting 117/72 mm Hg, Pulse sitting 66, Temp 98.6, Wt 198, Ht 68 in, BMI  30.10 Index, Oxygen sat % 98 %, Ht-cm 172.72 cm, Wt-kg 89.81.      Examination    ---     _Exam (R.G.)_ :    GENERAL APPEARANCE: no acute distress.    HEAD: normocephalic atraumatic.    SKIN: Couple of toe nails thickened. Macular rash on foot and between toes.  scaly.[email or web address] Kitchen    CARDIOVASCULAR: regular rate and rhythm, no murmur.    RESPIRATORY: clear to auscultation.    CVA tenderness: No.    EXTREMITIES no edema.    PSYCH: Good eye contact, appropriate affect, appropriate mood, alert.          Assessments    ---    1\. PTSD (post-traumatic stress disorder) - F43.10 (Primary)    ---    2\. Encounter for immunization - Z23    ---    3\. Fatigue, unspecified type - R53.83    ---    4\. Venereal disease screening - Z11.3    ---    5\. Tinea pedis of both feet - B35.3    ---      List of counselor given. will check STD's and want to be checked for mono.  Lamisil Rx. , **Medication side effects discussed with the patient and  understands, F/U 2wks for physical.    ---      Treatment    ---       **1\. Fatigue, unspecified type**    _LAB: COMPREHENSIVE METABOLIC PANEL_    _LAB: EBV ACUTE INFECTION ANTIBODIES_    ---         **2\. Venereal disease screening**    _LAB: CHLAMYDIA/GC APTIMA NAA,  URINE_    _LAB: HEPATITIS PANEL_    _LAB: HIV AG AB_    _LAB: HSV TYPE 2 SPECIFIC IGG_    _LAB: RPR TITER_         **3\. Tinea pedis of both feet**    Start Terbinafine HCl Tablet, 250 MG, 1 tablet, Orally, Once a day, 14 days,  14 Tablet, Refills 0      Immunization    ---      Tdap : 0.5 mL (Route: Intramuscular) given by Shary Key on Left Deltoid  (Encounter for immunization)    ---      Procedure Codes    ---      [phone] Tdap    ---    [phone] IMMUNIZATION ADMIN    ---      Follow Up    ---    2 Weeks (Reason: Annual)     Electronically signed by Gaye Alken , MD on 05/10/2016 at 02:00 PM EDT    Sign off status: Completed        * * [facility] [place]    [address]    [place], [place] [address]    Tel: [phone]    Fax: [phone]              * * *          Patient: Nadim, Malia DOB: 09-27-1985 Progress Note: Gaye Alken  05/10/2016    ---    Note generated by eClinicalWorks EMR/PM Software ([email or web address])

## 2020-12-07 NOTE — Progress Notes (Signed)
* * *        **Vincent Oconnor**    --- ---    13 Y old Male, DOB: March 17, 1986    Account Number: [id number]    [address] , [place], [place]    Home: [phone]    Guarantor: Vincent Oconnor Insurance:  [employer] Payer ID: [id number]    Appointment Facility: [facility] [place]        * * *    05/24/2016  Progress Notes: Gaye Alken    --- ---    ---        Current Medications    ---    Taking     * Terbinafine HCl 250 MG Tablet 1 tablet Orally Once a day, stop date 05/24/2016    ---    * Medication List reviewed and reconciled with the patient    ---      Past Medical History    ---       PTSD.        ---      Surgical History    ---      Denies Past Surgical History    ---      Family History    ---      Father: alive 28 yrs    ---    Mother: alive 29 yrs    ---    2 brother(s) , 1 sister(s) .    ---    Brother deceased of overdose.    ---      Social History    ---     Violeta Gelinas 2017:_    Previsit Prep discussed with care team/Quality Folder Updated Last updated on:  05/24/2016. Self referrals since last visit \--> No. Hospital/ED/SNF stay  since last visit \--> No. BMI Follow-Up Weight Assessment Findings:  Overweight, BMI management provided Yes, Above Normal BMI Follow-up Dietary  management education, guidance, and counseling. Depression Screening (PHQ2)  Little interest or pleasure in doing things? No, Feeling down, depressed or  hopeless? Yes. Depression Screening (PHQ9) Total Score 6, Feeling bad about  yourself, or that you are a failure, or have let yourself or your family down  Not at all, Feeling down, depressed, or hopeless Not at all, Feeling tired or  having little energy Several days, Interpretation Mild Depression, Little  interest or pleasure in doing things More than half the days, Moving or  speaking so slowly that other people could have noticed. Or the opposite being  so fidgety or restless that you have been moving around a lot more than usual  Not at all,  Poor appetite or overeating Not at all, Thoughts that you would be  better off dead, or of hurting yourself in some way Not at all, Trouble  concentrating on things, such as reading the newspaper or watching television  Several days, Trouble falling or staying asleep, or sleeping too much More  than half the days. Depression Follow-Up Plan Depression Screening Findings  Negative, Follow-Up for Depression Mental health care management. Smoking Are  you a: nonsmoker.    _Family, Social and Culture:_    Alcohol Screening Did you have a drink containing alcohol in the past year?  Yes, How often did you have six or more drinks on one occasion in the past  year? Never (0 points), How many drinks did you have on a typucal day when you  were drinking in the past year? 1 or 2 (0 points), How often did  you have a  drink containing alcohol in the past year? Two to four times a month (2  points), Points 2\. Communication Limitations: None. Education \--> finished  college. Exercise Frequency: Occasionally. Housing Number of adults in  household: 1\. Marital Status \--> single. Nutrition Diet no special diet.  Occupation Employment Status: Employed, --Scientist, research (medical).    _Behavioral Health:_    Family History Family History Of Substance Abuse: No, Family History of Mental  Health: No.    _Sexual History:_    Sexual History Had sex in the past 12 months (vaginal, oral, or anal)? Yes,  with Women only.      Allergies    ---      N.K.D.A.    ---      Hospitalization/Major Diagnostic Procedure    ---      Denies Past Hospitalization    ---      Review of Systems    ---     _ANNUAL_ROS_ :    CONST: NEGATIVE FOR:, Fevers, Chills, Fatigue. EYES: NEGATIVE FOR: , Vision  changes, Eye Pain. EARS/NOSE/THROAT NEGATIVE FOR:, hearing loss , nasal  stuffiness. CV: NEGATIVE FOR:, Chest Pain, Palpitations . RESPIRATORY:  NEGATIVE FOR: , Cough, Excessive snoring, Wheeze. GI: NEGATIVE FOR: ,  abdominal pain , nausea, vomitting. MSK: NEGATIVE FOR: ,  Myalgias, Back pain.  NEURO: NEGATIVE FOR: , HA, Dizziness.            Reason for Appointment    ---      1\. Ann    ---      History of Present Illness    ---     _-HPI-_ :    Pt is here for physical. Pt is not smoking. Pt has been on terbinafine and pt  stated it helped for his athlete's foot. Pt denied any CP, any SOB.      Vital Signs    ---    BP sitting 110/70 mm Hg, Pulse sitting 81, Temp 98.5, Wt 199, Ht 68 in, BMI  30.25 Index, Oxygen sat % 98 %, Weight Change 1 lb, Ht-cm 172.72 cm, Wt-kg  90.27.      Examination    ---     _Exam (R.G.)_ :    GENERAL APPEARANCE: no acute distress .    HEAD: normocephalic atraumatic .    SKIN: Couple of toe nails thickened. Macular rash on foot and between toes.  scaly. Improving.    EYES: conjunctiva clear, non-icteric.    EARS: canals ok, tympanic membranes normal.    Oropharynx Moist Mucous Membranes.    NECK: no cervical or supraclavicular lymphadenopathy, no thyromegaly.    CARDIOVASCULAR: regular rate and rhythm, no murmur .    RESPIRATORY: clear to auscultation .    CVA tenderness: No .    GASTROINTESTINAL: abdomen soft non-tender.    ABDOMEN: bowel sounds normal, soft non-tender, no HSM.    EXTREMITIES no edema .    PERIPHERAL PULSES: pedal pulses 2+ bilaterally.    MUSCULOSKELETAL No joint swelling.    PSYCH: Good eye contact, appropriate affect, appropriate mood, alert .          Assessments    ---    1\. Adult general medical examination - Z00.00 (Primary)    ---    2\. Tinea pedis of both feet - B35.3    ---    3\. Encounter for antibody response examination - Z01.84    ---    4\. Encounter for immunization - Z23    ---  Flu given. Pt works as Surveyor, minerals in shots. WIll check Hep B  titer. Annually. Encourage to exercise on regular basis.    ---      Treatment    ---       **1\. Adult general medical examination**    Notes: Health screening - men - ages 38 to 28 material was published to  portal.    ---        **2\. Tinea pedis of both feet**     Refill Terbinafine HCl Tablet, 250 MG, 1 tablet, Orally, Once a day, 14 days,  14 Tablet, Refills 0         **3\. Encounter for antibody response examination**    _LAB: HBS AB_       Immunization    ---      Influenza : 0.5 mL (Route: Intramuscular) given by Shary Key on Left  Deltoid (Encounter for immunization)    ---      Procedure Codes    ---      [id number] Flucelvax Quad 4 and older    ---    [id number] IMMUNIZATION ADMIN    ---      Follow Up    ---    1 Year (Reason: Annual)    Electronically signed by Gaye Alken , MD on 05/24/2016 at 03:30 PM EDT    Sign off status: Completed        * * [facility] [place]    [address]    [place], [place] [address]    Tel: [phone]    Fax: [phone]              * * *          Patient: Vincent Oconnor, Vincent Oconnor DOB: May 17, 1986 Progress Note: Gaye Alken  05/24/2016    ---    Note generated by eClinicalWorks EMR/PM Software ([email or web address])

## 2020-12-07 NOTE — Progress Notes (Signed)
* * *        **  [name]**    --- ---    35 Y old Male, DOB: 04-02-1986    [address] , [place], [place], [place] [address]    Home: [phone]    Provider: Gaye Alken        * * *    Telephone Encounter    ---    Answered by   Blima Rich  Date: 05/24/2017         Time: 01:30 PM    Reason   Annual O/R    --- ---            Message                      sent                     * * *                ---          * * *          Patient: Vincent Oconnor, Vincent Oconnor DOB: [birth year] Provider: Gaye Alken 05/24/2017    ---    Note generated by eClinicalWorks EMR/PM Software ([email or web address])

## 2020-12-30 ENCOUNTER — Other Ambulatory Visit (HOSPITAL_BASED_OUTPATIENT_CLINIC_OR_DEPARTMENT_OTHER): Payer: Self-pay

## 2020-12-30 MED FILL — Fenofibrate Tab 145 MG: ORAL | 30 days supply | Qty: 30 | Fill #0 | Status: AC

## 2021-01-19 ENCOUNTER — Other Ambulatory Visit: Payer: Self-pay

## 2021-01-19 ENCOUNTER — Other Ambulatory Visit: Payer: Self-pay | Admitting: Family Medicine

## 2021-01-19 ENCOUNTER — Other Ambulatory Visit (INDEPENDENT_AMBULATORY_CARE_PROVIDER_SITE_OTHER): Payer: 59

## 2021-01-19 DIAGNOSIS — E7849 Other hyperlipidemia: Secondary | ICD-10-CM | POA: Diagnosis not present

## 2021-01-19 DIAGNOSIS — R7989 Other specified abnormal findings of blood chemistry: Secondary | ICD-10-CM

## 2021-01-19 LAB — HEPATIC FUNCTION PANEL
ALT: 74 U/L — ABNORMAL HIGH (ref 0–53)
AST: 40 U/L — ABNORMAL HIGH (ref 0–37)
Albumin: 5 g/dL (ref 3.5–5.2)
Alkaline Phosphatase: 63 U/L (ref 39–117)
Bilirubin, Direct: 0.2 mg/dL (ref 0.0–0.3)
Total Bilirubin: 0.8 mg/dL (ref 0.2–1.2)
Total Protein: 7.5 g/dL (ref 6.0–8.3)

## 2021-01-19 LAB — LIPID PANEL
Cholesterol: 91 mg/dL (ref 0–200)
HDL: 28.6 mg/dL — ABNORMAL LOW (ref >39.00)
LDL Cholesterol: 40 mg/dL (ref 0–99)
NonHDL: 61.99
Total CHOL/HDL Ratio: 3
Triglycerides: 111 mg/dL (ref 0.0–149.0)
VLDL: 22.2 mg/dL (ref 0.0–40.0)

## 2021-01-21 ENCOUNTER — Other Ambulatory Visit: Payer: Self-pay

## 2021-01-21 ENCOUNTER — Ambulatory Visit (HOSPITAL_BASED_OUTPATIENT_CLINIC_OR_DEPARTMENT_OTHER)
Admission: RE | Admit: 2021-01-21 | Discharge: 2021-01-21 | Disposition: A | Discharge status: Home / Self Care | Payer: 59 | Source: Ambulatory Visit | Attending: Family Medicine | Admitting: Family Medicine

## 2021-01-21 DIAGNOSIS — R7989 Other specified abnormal findings of blood chemistry: Secondary | ICD-10-CM | POA: Insufficient documentation

## 2021-01-21 DIAGNOSIS — K760 Fatty (change of) liver, not elsewhere classified: Secondary | ICD-10-CM | POA: Diagnosis not present

## 2021-01-25 ENCOUNTER — Encounter: Payer: Self-pay | Admitting: Family Medicine

## 2021-01-25 DIAGNOSIS — K760 Fatty (change of) liver, not elsewhere classified: Secondary | ICD-10-CM | POA: Insufficient documentation

## 2021-02-09 ENCOUNTER — Other Ambulatory Visit (HOSPITAL_COMMUNITY): Payer: Self-pay

## 2021-02-09 MED FILL — Fenofibrate Tab 145 MG: ORAL | 30 days supply | Qty: 30 | Fill #0 | Status: AC

## 2021-04-08 ENCOUNTER — Other Ambulatory Visit (HOSPITAL_COMMUNITY): Payer: Self-pay

## 2021-04-08 ENCOUNTER — Other Ambulatory Visit: Payer: Self-pay | Admitting: Family Medicine

## 2021-04-08 MED ORDER — FENOFIBRATE 145 MG PO TABS
ORAL_TABLET | Freq: Every day | ORAL | 3 refills | Status: DC
  Filled 2021-04-08: qty 30, 30d supply, fill #0
  Filled 2021-05-19: qty 30, 30d supply, fill #1
  Filled 2021-06-29: qty 30, 30d supply, fill #2
  Filled 2021-08-25: qty 30, 30d supply, fill #3

## 2021-04-08 MED FILL — Rosuvastatin Calcium Tab 20 MG: ORAL | 90 days supply | Qty: 90 | Fill #0 | Status: CN

## 2021-04-09 ENCOUNTER — Other Ambulatory Visit (HOSPITAL_BASED_OUTPATIENT_CLINIC_OR_DEPARTMENT_OTHER): Payer: Self-pay

## 2021-04-09 ENCOUNTER — Other Ambulatory Visit (HOSPITAL_COMMUNITY): Payer: Self-pay

## 2021-04-09 MED FILL — Rosuvastatin Calcium Tab 20 MG: ORAL | 90 days supply | Qty: 90 | Fill #0 | Status: AC

## 2021-05-19 ENCOUNTER — Other Ambulatory Visit (HOSPITAL_COMMUNITY): Payer: Self-pay

## 2021-05-21 ENCOUNTER — Other Ambulatory Visit (HOSPITAL_COMMUNITY): Payer: Self-pay

## 2021-06-06 ENCOUNTER — Emergency Department (HOSPITAL_COMMUNITY)
Admission: EM | Admit: 2021-06-06 | Discharge: 2021-06-07 | Disposition: A | Discharge status: Home / Self Care | Payer: 59 | Attending: Emergency Medicine | Admitting: Emergency Medicine

## 2021-06-06 ENCOUNTER — Other Ambulatory Visit: Payer: Self-pay

## 2021-06-06 ENCOUNTER — Emergency Department (HOSPITAL_COMMUNITY): Payer: 59

## 2021-06-06 ENCOUNTER — Encounter (HOSPITAL_COMMUNITY): Payer: Self-pay | Admitting: Emergency Medicine

## 2021-06-06 DIAGNOSIS — M545 Low back pain, unspecified: Secondary | ICD-10-CM | POA: Insufficient documentation

## 2021-06-06 DIAGNOSIS — M25562 Pain in left knee: Secondary | ICD-10-CM | POA: Insufficient documentation

## 2021-06-06 DIAGNOSIS — S060X0A Concussion without loss of consciousness, initial encounter: Secondary | ICD-10-CM | POA: Insufficient documentation

## 2021-06-06 DIAGNOSIS — Y92410 Unspecified street and highway as the place of occurrence of the external cause: Secondary | ICD-10-CM | POA: Diagnosis not present

## 2021-06-06 DIAGNOSIS — M25561 Pain in right knee: Secondary | ICD-10-CM | POA: Insufficient documentation

## 2021-06-06 DIAGNOSIS — R519 Headache, unspecified: Secondary | ICD-10-CM | POA: Diagnosis not present

## 2021-06-06 DIAGNOSIS — S0990XA Unspecified injury of head, initial encounter: Secondary | ICD-10-CM | POA: Diagnosis present

## 2021-06-06 MED ORDER — IBUPROFEN 200 MG PO TABS
600.0000 mg | ORAL_TABLET | Freq: Once | ORAL | Status: AC
Start: 2021-06-07 — End: 2021-06-06
  Administered 2021-06-06: 600 mg via ORAL
  Filled 2021-06-06: qty 3

## 2021-06-06 MED ORDER — ACETAMINOPHEN 500 MG PO TABS
1000.0000 mg | ORAL_TABLET | Freq: Once | ORAL | Status: AC
  Administered 2021-06-06: 1000 mg via ORAL
  Filled 2021-06-06: qty 2

## 2021-06-06 MED ORDER — CYCLOBENZAPRINE HCL 10 MG PO TABS
10.0000 mg | ORAL_TABLET | Freq: Once | ORAL | Status: AC
  Administered 2021-06-07: 10 mg via ORAL
  Filled 2021-06-06: qty 1

## 2021-06-06 MED ORDER — IOHEXOL 350 MG/ML SOLN: 80.0000 mL | Freq: Once | INTRAVENOUS | Status: DC | PRN
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
## 2021-06-06 NOTE — Discharge Instructions (Addendum)
As we discussed today in addition to taking medications like ibuprofen to help with your pain I would recommend that you also use hot/cold therapy, and gentle stretching and range of motion.  It is important that you are getting up and moving around.  I have given you information for the concussion clinic. If your head is still hurting after 2 days please schedule a follow-up appointment.  Your x-rays were reassuring.  If you still have knee pain after a week or does not significantly improved in that time please follow-up with orthopedics, whose information I have also given you.  Please take Ibuprofen (Advil, motrin) and Tylenol (acetaminophen) to relieve your pain.    You may take up to 600 MG (3 pills) of normal strength ibuprofen every 6 hours as needed.   You make take tylenol, up to 1,000 mg (two extra strength pills) every 6 hours as needed.   It is safe to take ibuprofen and tylenol at the same time as they work differently.   Do not take more than 4,000 mg tylenol in a 24 hour period (not more than one dose every 6 hours.  Please check all medication labels as many medications such as pain and cold medications may contain tylenol.  Do not drink alcohol while taking these medications.  Do not take other NSAID'S while taking ibuprofen (such as aleve or naproxen).  Please take ibuprofen with food to decrease stomach upset.  Today you received medications that may make you sleepy or impair your ability to make decisions.  For the next 24 hours please do not drive, operate heavy machinery, care for a small child with out another adult present, or perform any activities that may cause harm to you or someone else if you were to fall asleep or be impaired.   You are being prescribed a medication which may make you sleepy. Please follow up of listed precautions for at least 24 hours after taking one dose.

## 2021-06-06 NOTE — ED Notes (Signed)
Pt refused crutches.
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
## 2021-06-07 DIAGNOSIS — M25562 Pain in left knee: Secondary | ICD-10-CM | POA: Diagnosis not present

## 2021-06-07 DIAGNOSIS — M545 Low back pain, unspecified: Secondary | ICD-10-CM | POA: Diagnosis not present

## 2021-06-07 DIAGNOSIS — M25561 Pain in right knee: Secondary | ICD-10-CM | POA: Diagnosis not present

## 2021-06-07 DIAGNOSIS — S060X0A Concussion without loss of consciousness, initial encounter: Secondary | ICD-10-CM | POA: Diagnosis not present

## 2021-06-07 MED ORDER — METHOCARBAMOL 750 MG PO TABS: 750.0000 mg | ORAL_TABLET | Freq: Three times a day (TID) | ORAL | 0 refills | Status: DC | PRN

## 2021-06-30 ENCOUNTER — Ambulatory Visit: Payer: 59 | Admitting: Family Medicine

## 2021-06-30 ENCOUNTER — Other Ambulatory Visit (HOSPITAL_COMMUNITY): Payer: Self-pay
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
## 2021-07-01 ENCOUNTER — Ambulatory Visit (INDEPENDENT_AMBULATORY_CARE_PROVIDER_SITE_OTHER): Payer: 59 | Admitting: Sports Medicine

## 2021-07-01 ENCOUNTER — Other Ambulatory Visit: Payer: Self-pay

## 2021-07-01 VITALS — BP 118/72 | HR 91 | Ht 68.0 in | Wt 219.0 lb

## 2021-07-01 DIAGNOSIS — S060X0A Concussion without loss of consciousness, initial encounter: Secondary | ICD-10-CM

## 2021-07-01 DIAGNOSIS — R270 Ataxia, unspecified: Secondary | ICD-10-CM

## 2021-07-01 DIAGNOSIS — G44319 Acute post-traumatic headache, not intractable: Secondary | ICD-10-CM

## 2021-07-01 NOTE — Patient Instructions (Addendum)
Cont tylenol and nsaids for headache Use sunglasses at all times Limit screen time to less than 2 hrs per day Do prescribed exercises at least 3x a week Follow up in 2 weeks

## 2021-07-07 DIAGNOSIS — R51 Headache with orthostatic component, not elsewhere classified: Secondary | ICD-10-CM | POA: Diagnosis not present

## 2021-07-07 DIAGNOSIS — F0781 Postconcussional syndrome: Secondary | ICD-10-CM | POA: Diagnosis not present

## 2021-07-07 DIAGNOSIS — M542 Cervicalgia: Secondary | ICD-10-CM | POA: Diagnosis not present

## 2021-07-14 DIAGNOSIS — R51 Headache with orthostatic component, not elsewhere classified: Secondary | ICD-10-CM | POA: Diagnosis not present

## 2021-07-14 DIAGNOSIS — M542 Cervicalgia: Secondary | ICD-10-CM | POA: Diagnosis not present

## 2021-07-14 DIAGNOSIS — F0781 Postconcussional syndrome: Secondary | ICD-10-CM | POA: Diagnosis not present
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
## 2021-07-15 ENCOUNTER — Other Ambulatory Visit: Payer: Self-pay

## 2021-07-15 ENCOUNTER — Ambulatory Visit (INDEPENDENT_AMBULATORY_CARE_PROVIDER_SITE_OTHER): Payer: 59 | Admitting: Sports Medicine

## 2021-07-15 VITALS — BP 140/90 | HR 88 | Ht 68.0 in | Wt 214.0 lb

## 2021-07-15 DIAGNOSIS — S060X0D Concussion without loss of consciousness, subsequent encounter: Secondary | ICD-10-CM | POA: Diagnosis not present

## 2021-07-15 NOTE — Patient Instructions (Addendum)
Good to see you  Follow up as needed  

## 2021-07-19 DIAGNOSIS — M542 Cervicalgia: Secondary | ICD-10-CM | POA: Diagnosis not present

## 2021-07-19 DIAGNOSIS — F0781 Postconcussional syndrome: Secondary | ICD-10-CM | POA: Diagnosis not present

## 2021-07-19 DIAGNOSIS — R51 Headache with orthostatic component, not elsewhere classified: Secondary | ICD-10-CM | POA: Diagnosis not present

## 2021-08-25 ENCOUNTER — Other Ambulatory Visit (HOSPITAL_COMMUNITY): Payer: Self-pay

## 2021-08-25 ENCOUNTER — Other Ambulatory Visit: Payer: Self-pay | Admitting: Family Medicine

## 2021-08-25 MED ORDER — ROSUVASTATIN CALCIUM 20 MG PO TABS
ORAL_TABLET | Freq: Every day | ORAL | 3 refills | Status: DC
  Filled 2021-08-25: qty 90, 90d supply, fill #0
  Filled 2022-01-17: qty 90, 90d supply, fill #1
  Filled 2022-05-12: qty 90, 90d supply, fill #2

## 2021-10-21 ENCOUNTER — Other Ambulatory Visit: Payer: Self-pay | Admitting: Family Medicine

## 2021-10-22 ENCOUNTER — Other Ambulatory Visit (HOSPITAL_COMMUNITY): Payer: Self-pay

## 2021-10-22 MED ORDER — FENOFIBRATE 145 MG PO TABS
ORAL_TABLET | Freq: Every day | ORAL | 3 refills | Status: DC
  Filled 2021-10-22: qty 30, 30d supply, fill #0
  Filled 2021-12-05: qty 30, 30d supply, fill #1
  Filled 2022-01-17: qty 30, 30d supply, fill #2
  Filled 2022-03-14: qty 30, 30d supply, fill #3

## 2021-12-03 ENCOUNTER — Ambulatory Visit (INDEPENDENT_AMBULATORY_CARE_PROVIDER_SITE_OTHER): Payer: 59 | Admitting: Family Medicine

## 2021-12-03 ENCOUNTER — Encounter: Payer: Self-pay | Admitting: Family Medicine

## 2021-12-03 VITALS — BP 130/78 | HR 59 | Temp 98.1°F | Resp 16 | Ht 68.0 in | Wt 213.7 lb

## 2021-12-03 DIAGNOSIS — Z0000 Encounter for general adult medical examination without abnormal findings: Secondary | ICD-10-CM

## 2021-12-03 LAB — LIPID PANEL
Cholesterol: 119 mg/dL (ref 0–200)
HDL: 35.7 mg/dL — ABNORMAL LOW (ref >39.00)
LDL Cholesterol: 62 mg/dL (ref 0–99)
NonHDL: 83.56
Total CHOL/HDL Ratio: 3
Triglycerides: 110 mg/dL (ref 0.0–149.0)
VLDL: 22 mg/dL (ref 0.0–40.0)

## 2021-12-03 LAB — COMPREHENSIVE METABOLIC PANEL
ALT: 51 U/L (ref 0–53)
AST: 29 U/L (ref 0–37)
Albumin: 5 g/dL (ref 3.5–5.2)
Alkaline Phosphatase: 54 U/L (ref 39–117)
BUN: 16 mg/dL (ref 6–23)
CO2: 30 mEq/L (ref 19–32)
Calcium: 9.9 mg/dL (ref 8.4–10.5)
Chloride: 105 mEq/L (ref 96–112)
Creatinine, Ser: 1.01 mg/dL (ref 0.40–1.50)
GFR: 95.84 mL/min (ref >60.00)
Glucose, Bld: 79 mg/dL (ref 70–99)
Potassium: 4.5 mEq/L (ref 3.5–5.1)
Sodium: 141 mEq/L (ref 135–145)
Total Bilirubin: 0.9 mg/dL (ref 0.2–1.2)
Total Protein: 7.3 g/dL (ref 6.0–8.3)

## 2021-12-03 LAB — CBC
HCT: 41.6 % (ref 39.0–52.0)
Hemoglobin: 14.5 g/dL (ref 13.0–17.0)
MCHC: 34.9 g/dL (ref 30.0–36.0)
MCV: 88.1 fl (ref 78.0–100.0)
Platelets: 338 10*3/uL (ref 150.0–400.0)
RBC: 4.72 Mil/uL (ref 4.22–5.81)
RDW: 12.3 % (ref 11.5–15.5)
WBC: 8.4 10*3/uL (ref 4.0–10.5)

## 2021-12-03 NOTE — Progress Notes (Signed)
Chief Complaint  ?Patient presents with  ? Annual Exam  ?  Here Annual Exam   ? ? ?Well Male ?Jimmy Barrett is here for a complete physical.   ?His last physical was >1 year ago.  ?Current diet: in general, a "healthy" diet.   ?Current exercise: walking ?Weight trend: intentionally decreased  ?Fatigue out of ordinary? No. ?Seat belt? Yes.   ?Advanced directive? No ? ?Health maintenance ?Tetanus- Yes ?HIV- Yes ?Hep C- Yes ? ?Past Medical History:  ?Diagnosis Date  ? NAFLD (nonalcoholic fatty liver disease)   ?  ? ?Past Surgical History:  ?Procedure Laterality Date  ? OTHER SURGICAL HISTORY    ? Shrapnel removed from leg in [place]  ? ? ?Medications  ?Current Outpatient Medications on File Prior to Visit  ?Medication Sig Dispense Refill  ? Cinnamon 500 MG capsule Take 500 mg by mouth daily.    ? cyclobenzaprine (FLEXERIL) 10 MG tablet One half tab PO qHS, then increase gradually to one tab TID. 30 tablet 1  ? Diclofenac Sodium (PENNSAID) 2 % SOLN Place 1 application onto the skin 2 (two) times daily. 112 g 3  ? fenofibrate (TRICOR) 145 MG tablet TAKE 1 TABLET BY MOUTH DAILY. 30 tablet 3  ? L-Tyrosine 500 MG CAPS Take 1 capsule by mouth daily.    ? methocarbamol (ROBAXIN) 750 MG tablet Take 1-2 tablets (750-1,500 mg total) by mouth 3 (three) times daily as needed for muscle spasms. 36 tablet 0  ? rosuvastatin (CRESTOR) 20 MG tablet TAKE 1 TABLET BY MOUTH ONCE DAILY 90 tablet 3  ? Turmeric Curcumin 500 MG CAPS Take 1 capsule by mouth daily.    ? ?Allergies ?No Known Allergies ? ?Family History ?Family History  ?Problem Relation Age of Onset  ? Cancer Father   ?     passed away from colon Cancer  ? ? ?Review of Systems: ?Constitutional: no fevers or chills ?Eye:  no recent significant change in vision ?Ear/Nose/Mouth/Throat:  Ears:  no hearing loss ?Nose/Mouth/Throat:  no complaints of nasal congestion, no sore throat ?Cardiovascular:  no chest pain ?Respiratory:  no shortness of breath ?Gastrointestinal:  no  abdominal pain, no change in bowel habits ?GU:  Male: negative for dysuria ?Musculoskeletal/Extremities:  no new pain of the joints ?Integumentary (Skin/Breast):  no abnormal skin lesions reported ?Neurologic:  no current headaches ?Endocrine: No unexpected weight changes ?Hematologic/Lymphatic:  no night sweats ? ?Exam ?BP 130/78 (BP Location: Right Arm, Patient Position: Sitting, Cuff Size: Normal)   Pulse (!) 59   Temp 98.1 ?F (36.7 ?C) (Oral)   Resp 16   Ht 5\' 8"  (1.727 m)   Wt 213 lb 11.2 oz (96.9 kg)   SpO2 97%   BMI 32.49 kg/m?  ?General:  well developed, well nourished, in no apparent distress ?Skin:  no significant moles, warts, or growths ?Head:  no masses, lesions, or tenderness ?Eyes:  pupils equal and round, sclera anicteric without injection ?Ears:  canals without lesions, TMs shiny without retraction, no obvious effusion, no erythema ?Nose:  nares patent, septum midline, mucosa normal ?Throat/Pharynx:  lips and gingiva without lesion; tongue and uvula midline; non-inflamed pharynx; no exudates or postnasal drainage ?Neck: neck supple without adenopathy, thyromegaly, or masses ?Lungs:  clear to auscultation, breath sounds equal bilaterally, no respiratory distress ?Cardio:  regular rate and rhythm, no bruits, no LE edema ?Abdomen:  abdomen soft, nontender; bowel sounds normal; no masses or organomegaly ?Genital (male): Deferred ?Rectal: Deferred ?Musculoskeletal:  symmetrical muscle groups noted  without atrophy or deformity ?Extremities:  no clubbing, cyanosis, or edema, no deformities, no skin discoloration ?Neuro:  gait normal; deep tendon reflexes normal and symmetric ?Psych: well oriented with normal range of affect and appropriate judgment/insight ? ?Assessment and Plan ? ?Well adult exam - Plan: CBC, Comprehensive metabolic panel, Lipid panel  ? ?Well 36 y.o. male. ?Counseled on diet and exercise. ?Self testicular exams recommended at least monthly.  ?Advanced directive form provided  today.  ?Other orders as above. ?Follow up in 1 year pending the above workup. ?The patient voiced understanding and agreement to the plan. ? ?Sharlene Dory, DO ?12/03/21 ?1:17 PM ? ?

## 2021-12-03 NOTE — Patient Instructions (Addendum)
Give us 2-3 business days to get the results of your labs back.   Keep the diet clean and stay active.  Do monthly self testicular checks in the shower. You are feeling for lumps/bumps that don't belong. If you feel anything like this, let me know!  Please get me a copy of your advanced directive form at your convenience.   Let us know if you need anything.  

## 2021-12-04 ENCOUNTER — Encounter: Payer: Self-pay | Admitting: Family Medicine

## 2021-12-05 ENCOUNTER — Other Ambulatory Visit (HOSPITAL_COMMUNITY): Payer: Self-pay

## 2021-12-06 ENCOUNTER — Other Ambulatory Visit (HOSPITAL_COMMUNITY): Payer: Self-pay

## 2022-01-18 ENCOUNTER — Other Ambulatory Visit (HOSPITAL_COMMUNITY): Payer: Self-pay

## 2022-03-14 ENCOUNTER — Other Ambulatory Visit (HOSPITAL_COMMUNITY): Payer: Self-pay

## 2022-03-21 ENCOUNTER — Other Ambulatory Visit (HOSPITAL_COMMUNITY): Payer: Self-pay

## 2022-05-12 ENCOUNTER — Other Ambulatory Visit: Payer: Self-pay | Admitting: Family Medicine

## 2022-05-13 ENCOUNTER — Other Ambulatory Visit (HOSPITAL_COMMUNITY): Payer: Self-pay

## 2022-05-13 MED ORDER — FENOFIBRATE 145 MG PO TABS
ORAL_TABLET | Freq: Every day | ORAL | 3 refills | Status: DC
  Filled 2022-05-13: qty 30, 30d supply, fill #0
  Filled 2022-06-28: qty 30, 30d supply, fill #1
  Filled 2022-09-18: qty 30, 30d supply, fill #2
  Filled 2022-10-22: qty 30, 30d supply, fill #3

## 2022-05-23 ENCOUNTER — Other Ambulatory Visit (HOSPITAL_COMMUNITY): Payer: Self-pay

## 2022-06-06 ENCOUNTER — Ambulatory Visit: Payer: 59 | Admitting: Family Medicine

## 2022-06-08 ENCOUNTER — Ambulatory Visit: Payer: 59 | Admitting: Family Medicine

## 2022-06-08 ENCOUNTER — Encounter: Payer: Self-pay | Admitting: Family Medicine

## 2022-06-08 VITALS — BP 110/78 | HR 75 | Temp 97.0°F | Ht 69.0 in | Wt 215.4 lb

## 2022-06-08 DIAGNOSIS — Z23 Encounter for immunization: Secondary | ICD-10-CM

## 2022-06-08 DIAGNOSIS — E782 Mixed hyperlipidemia: Secondary | ICD-10-CM

## 2022-06-08 NOTE — Addendum Note (Signed)
Addended by: Sharon Seller B on: 06/08/2022 02:08 PM   Modules accepted: Orders

## 2022-06-08 NOTE — Progress Notes (Signed)
Chief Complaint  Patient presents with   Follow-up    6 month     Subjective: Hyperlipidemia Patient presents for mixed hyperlipidemia follow up. Currently taking Crestor 20 mg/d, Tricor 145 mg/d and compliance with treatment thus far has been good. He denies myalgias. He is usually adhering to a healthy diet. Exercise: active at work The patient is not known to have coexisting coronary artery disease. No CP or SOB.   Past Medical History:  Diagnosis Date   NAFLD (nonalcoholic fatty liver disease)     Objective: BP 110/78   Pulse 75   Temp (!) 97 F (36.1 C) (Oral)   Ht 5\' 9"  (1.753 m)   Wt 215 lb 6 oz (97.7 kg)   SpO2 97%   BMI 31.81 kg/m  General: Awake, appears stated age Heart: RRR, no LE edema, no bruits Lungs: CTAB, no rales, wheezes or rhonchi. No accessory muscle use Psych: Age appropriate judgment and insight, normal affect and mood  Assessment and Plan: Mixed hyperlipidemia - Plan: Hepatic function panel, Lipid panel  Chronic, stable. Cont Crestor 20 mg/d, Tricor 145 mg/d. Counseled on diet/exercise. If TG's normal, will lower Tricor as he has improved eating.  Flu shot today.  F/u in 6 mo or prn. The patient voiced understanding and agreement to the plan.  [place], DO 06/08/22  2:00 PM

## 2022-06-08 NOTE — Patient Instructions (Addendum)
Keep the diet clean and stay active.  Give us 2-3 business days to get the results of your labs back.   Let us know if you need anything.  

## 2022-06-09 ENCOUNTER — Encounter: Payer: Self-pay | Admitting: Family Medicine

## 2022-06-09 LAB — HEPATIC FUNCTION PANEL
ALT: 71 U/L — ABNORMAL HIGH (ref 0–53)
AST: 36 U/L (ref 0–37)
Albumin: 4.8 g/dL (ref 3.5–5.2)
Alkaline Phosphatase: 60 U/L (ref 39–117)
Bilirubin, Direct: 0.1 mg/dL (ref 0.0–0.3)
Total Bilirubin: 0.6 mg/dL (ref 0.2–1.2)
Total Protein: 7.4 g/dL (ref 6.0–8.3)

## 2022-06-09 LAB — LDL CHOLESTEROL, DIRECT: Direct LDL: 66 mg/dL

## 2022-06-09 LAB — LIPID PANEL
Cholesterol: 124 mg/dL (ref 0–200)
HDL: 35.9 mg/dL — ABNORMAL LOW (ref >39.00)
NonHDL: 88.43
Total CHOL/HDL Ratio: 3
Triglycerides: 241 mg/dL — ABNORMAL HIGH (ref 0.0–149.0)
VLDL: 48.2 mg/dL — ABNORMAL HIGH (ref 0.0–40.0)

## 2022-06-10 ENCOUNTER — Other Ambulatory Visit: Payer: Self-pay | Admitting: Family Medicine

## 2022-06-10 DIAGNOSIS — E782 Mixed hyperlipidemia: Secondary | ICD-10-CM

## 2022-06-10 DIAGNOSIS — R7989 Other specified abnormal findings of blood chemistry: Secondary | ICD-10-CM

## 2022-06-29 ENCOUNTER — Other Ambulatory Visit (HOSPITAL_COMMUNITY): Payer: Self-pay

## 2022-07-11 ENCOUNTER — Other Ambulatory Visit: Payer: 59

## 2022-07-15 ENCOUNTER — Other Ambulatory Visit: Payer: 59

## 2022-09-18 ENCOUNTER — Other Ambulatory Visit: Payer: Self-pay | Admitting: Family Medicine

## 2022-09-19 ENCOUNTER — Other Ambulatory Visit (HOSPITAL_COMMUNITY): Payer: Self-pay

## 2022-09-19 ENCOUNTER — Other Ambulatory Visit: Payer: Self-pay

## 2022-09-19 MED ORDER — ROSUVASTATIN CALCIUM 20 MG PO TABS
20.0000 mg | ORAL_TABLET | Freq: Every day | ORAL | 3 refills | Status: DC
  Filled 2022-09-19: qty 30, 30d supply, fill #0
  Filled 2022-10-22: qty 30, 30d supply, fill #1
  Filled 2022-12-07: qty 30, 30d supply, fill #2
  Filled 2023-01-18: qty 30, 30d supply, fill #3

## 2022-09-22 ENCOUNTER — Other Ambulatory Visit (HOSPITAL_COMMUNITY): Payer: Self-pay

## 2022-10-27 ENCOUNTER — Other Ambulatory Visit (HOSPITAL_COMMUNITY): Payer: Self-pay

## 2022-12-07 ENCOUNTER — Encounter: Payer: Self-pay | Admitting: Family Medicine

## 2022-12-07 ENCOUNTER — Other Ambulatory Visit: Payer: Self-pay | Admitting: Family Medicine

## 2022-12-07 ENCOUNTER — Ambulatory Visit (INDEPENDENT_AMBULATORY_CARE_PROVIDER_SITE_OTHER): Payer: Commercial Managed Care - PPO | Admitting: Family Medicine

## 2022-12-07 VITALS — BP 120/80 | HR 60 | Temp 97.5°F | Ht 69.0 in | Wt 213.4 lb

## 2022-12-07 DIAGNOSIS — Z23 Encounter for immunization: Secondary | ICD-10-CM | POA: Diagnosis not present

## 2022-12-07 DIAGNOSIS — Z0001 Encounter for general adult medical examination with abnormal findings: Secondary | ICD-10-CM

## 2022-12-07 DIAGNOSIS — E782 Mixed hyperlipidemia: Secondary | ICD-10-CM

## 2022-12-07 DIAGNOSIS — Z800 Family history of malignant neoplasm of digestive organs: Secondary | ICD-10-CM

## 2022-12-07 DIAGNOSIS — R0683 Snoring: Secondary | ICD-10-CM

## 2022-12-07 DIAGNOSIS — Z0000 Encounter for general adult medical examination without abnormal findings: Secondary | ICD-10-CM | POA: Diagnosis not present

## 2022-12-07 LAB — LIPID PANEL
Cholesterol: 125 mg/dL (ref 0–200)
HDL: 33.6 mg/dL — ABNORMAL LOW (ref >39.00)
LDL Cholesterol: 56 mg/dL (ref 0–99)
NonHDL: 91.22
Total CHOL/HDL Ratio: 4
Triglycerides: 174 mg/dL — ABNORMAL HIGH (ref 0.0–149.0)
VLDL: 34.8 mg/dL (ref 0.0–40.0)

## 2022-12-07 LAB — COMPREHENSIVE METABOLIC PANEL
ALT: 55 U/L — ABNORMAL HIGH (ref 0–53)
AST: 28 U/L (ref 0–37)
Albumin: 4.8 g/dL (ref 3.5–5.2)
Alkaline Phosphatase: 63 U/L (ref 39–117)
BUN: 19 mg/dL (ref 6–23)
CO2: 28 mEq/L (ref 19–32)
Calcium: 10 mg/dL (ref 8.4–10.5)
Chloride: 103 mEq/L (ref 96–112)
Creatinine, Ser: 1 mg/dL (ref 0.40–1.50)
GFR: 96.31 mL/min (ref >60.00)
Glucose, Bld: 89 mg/dL (ref 70–99)
Potassium: 4.5 mEq/L (ref 3.5–5.1)
Sodium: 138 mEq/L (ref 135–145)
Total Bilirubin: 0.7 mg/dL (ref 0.2–1.2)
Total Protein: 7.4 g/dL (ref 6.0–8.3)

## 2022-12-07 LAB — CBC
HCT: 42.1 % (ref 39.0–52.0)
Hemoglobin: 14.7 g/dL (ref 13.0–17.0)
MCHC: 34.9 g/dL (ref 30.0–36.0)
MCV: 88.1 fl (ref 78.0–100.0)
Platelets: 370 10*3/uL (ref 150.0–400.0)
RBC: 4.78 Mil/uL (ref 4.22–5.81)
RDW: 12.6 % (ref 11.5–15.5)
WBC: 7.7 10*3/uL (ref 4.0–10.5)

## 2022-12-07 NOTE — Patient Instructions (Addendum)
Give [place] 2-3 business days to get the results of your labs back.   Keep the diet clean and stay active.  Do monthly self testicular checks in the shower. You are feeling for lumps/bumps that don't belong. If you feel anything like this, let me know!  Please get me a copy of your advanced directive form at your convenience.   If you do not hear anything about your referrals in the next 1-2 weeks, call our office and ask for an update.  Let us know if you need anything.

## 2022-12-07 NOTE — Addendum Note (Signed)
Addended by: Sharon Seller B on: 12/07/2022 08:21 AM   Modules accepted: Orders

## 2022-12-07 NOTE — Progress Notes (Signed)
Chief Complaint  Patient presents with   Annual Exam    Referral Sleep test    Well Male Jimmy Barrett is here for a complete physical.   His last physical was >1 year ago.  Current diet: in general, a "pretty healthy" diet.   Current exercise: walking, active at work Weight trend: stable Fatigue out of ordinary? No. Seat belt? Yes.   Advanced directive? Unsure  Health maintenance Tetanus- Due HIV- Yes Hep C- Yes  Past Medical History:  Diagnosis Date   NAFLD (nonalcoholic fatty liver disease)      Past Surgical History:  Procedure Laterality Date   OTHER SURGICAL HISTORY     Shrapnel removed from leg in [place]    Medications  Current Outpatient Medications on File Prior to Visit  Medication Sig Dispense Refill   Cinnamon 500 MG capsule Take 500 mg by mouth daily.     fenofibrate (TRICOR) 145 MG tablet TAKE 1 TABLET BY MOUTH DAILY. 30 tablet 3   L-Tyrosine 500 MG CAPS Take 1 capsule by mouth daily.     rosuvastatin (CRESTOR) 20 MG tablet Take 1 tablet (20 mg total) by mouth daily. 90 tablet 3    Allergies No Known Allergies  Family History Family History  Problem Relation Age of Onset   Cancer Father        passed away from colon Cancer    Review of Systems: Constitutional: no fevers or chills Eye:  no recent significant change in vision Ear/Nose/Mouth/Throat:  Ears:  no hearing loss Nose/Mouth/Throat:  no complaints of nasal congestion, no sore throat Cardiovascular:  no chest pain Respiratory:  no shortness of breath Gastrointestinal:  no abdominal pain, no change in bowel habits GU:  Male: negative for dysuria Musculoskeletal/Extremities:  no pain of the joints Integumentary (Skin/Breast):  no abnormal skin lesions reported Neurologic:  no headaches Endocrine: No unexpected weight changes Hematologic/Lymphatic:  no night sweats  Exam BP 120/80 (BP Location: Left Arm, Patient Position: Sitting, Cuff Size: Normal)   Pulse 60   Temp (!) 97.5  F (36.4 C) (Oral)   Ht 5\' 9"  (1.753 m)   Wt 213 lb 6 oz (96.8 kg)   SpO2 98%   BMI 31.51 kg/m  General:  well developed, well nourished, in no apparent distress Skin:  no significant moles, warts, or growths Head:  no masses, lesions, or tenderness Eyes:  pupils equal and round, sclera anicteric without injection Ears:  canals without lesions, TMs shiny without retraction, no obvious effusion, no erythema Nose:  nares patent, mucosa normal Throat/Pharynx:  lips and gingiva without lesion; tongue and uvula midline; non-inflamed pharynx; no exudates or postnasal drainage Neck: neck supple without adenopathy, thyromegaly, or masses Lungs:  clear to auscultation, breath sounds equal bilaterally, no respiratory distress Cardio:  regular rate and rhythm, no bruits, no LE edema Abdomen:  abdomen soft, nontender; bowel sounds normal; no masses or organomegaly Genital (male): Deferred Rectal: Deferred Musculoskeletal:  symmetrical muscle groups noted without atrophy or deformity Extremities:  no clubbing, cyanosis, or edema, no deformities, no skin discoloration Neuro:  gait normal; deep tendon reflexes normal and symmetric Psych: well oriented with normal range of affect and appropriate judgment/insight  Assessment and Plan  Well adult exam - Plan: CBC, Comprehensive metabolic panel, Lipid panel  Snoring - Plan: Ambulatory referral to Neurology  Family history of colon cancer in father - Plan: Ambulatory referral to Gastroenterology   Well 37 y.o. male. Counseled on diet and exercise. Self testicular exams  recommended at least monthly.  Advanced directive form provided today.  Tdap today.  He does have a famhx of colon cancer in his dad and would have more peace of mind if he sees GI. Will place referral.  Some snoring per pt and wife. Would like eval for OSA. Referral placed.  Other orders as above. Follow up in 6 mo pending the above workup. The patient voiced understanding and  agreement to the plan.  [place], DO 12/07/22 8:18 AM

## 2022-12-08 ENCOUNTER — Other Ambulatory Visit (HOSPITAL_COMMUNITY): Payer: Self-pay

## 2022-12-08 ENCOUNTER — Other Ambulatory Visit: Payer: Self-pay

## 2022-12-08 MED ORDER — FENOFIBRATE 145 MG PO TABS
ORAL_TABLET | Freq: Every day | ORAL | 3 refills | Status: DC
  Filled 2022-12-08: qty 30, 30d supply, fill #0
  Filled 2023-01-18: qty 30, 30d supply, fill #1
  Filled 2023-02-23: qty 30, 30d supply, fill #2
  Filled 2023-05-09 – 2023-05-22 (×2): qty 30, 30d supply, fill #3

## 2022-12-20 ENCOUNTER — Telehealth: Payer: BC Managed Care – PPO | Admitting: Nurse Practitioner

## 2022-12-20 DIAGNOSIS — H9202 Otalgia, left ear: Secondary | ICD-10-CM | POA: Diagnosis not present

## 2022-12-20 MED ORDER — IPRATROPIUM BROMIDE 0.03 % NA SOLN: 2.0000 | Freq: Two times a day (BID) | NASAL | 12 refills | Status: DC

## 2022-12-20 NOTE — Progress Notes (Signed)
E-Visit for Ear Pain - Eustachian Tube Dysfunction   We are sorry that you are not feeling well. Here is how we plan to help!  Based on what you have shared with me it looks like you have Eustachian Tube Dysfunction.  Eustachian Tube Dysfunction is a condition where the tubes that connect your middle ears to your upper throat become blocked. This can lead to discomfort, hearing difficulties and a feeling of fullness in your ear. Eustachian tube dysfunction usually resolves itself in a few days. The usual symptoms include: Hearing problems Tinnitus, or ringing in your ears Clicking or popping sounds A feeling of fullness in your ears Pain that mimics an ear infection Dizziness, vertigo or balance problems A "tickling" sensation in your ears  ?Eustachian tube dysfunction symptoms may get worse in higher altitudes. This is called barotrauma, and it can happen while scuba diving, flying in an airplane or driving in the mountains.   What causes eustachian tube dysfunction? Allergies and infections (like the common cold and the flu) are the most common causes of eustachian tube dysfunction. These conditions can cause inflammation and mucus buildup, leading to blockage. GERD, or chronic acid reflux, can also cause ETD. This is because stomach acid can back up into your throat and result in inflammation. As mentioned above, altitude changes can also cause ETD.   What are some common eustachian tube dysfunction treatments? In most cases, treatment isn't necessary because ETD often resolves on its own. However, you might need treatment if your symptoms linger for more than two weeks.    Eustachian tube dysfunction treatment depends on the cause and the severity of your condition. Treatments may include home remedies, medications or, in severe cases, surgery.     HOME CARE: Sometimes simple home remedies can help with mild cases of eustachian tube dysfunction. To try and clear the blockage, you  can: Chew gum. Yawn. Swallow. Try the Valsalva maneuver (breathing out forcefully while closing your mouth and pinching your nostrils). Use a saline spray to clear out nasal passages.  MEDICATIONS: Over-the-counter medications can help if allergies are causing eustachian tube dysfunction. Try antihistamines (like cetirizine or diphenhydramine) to ease your symptoms. If you have discomfort, pain relievers -- such as acetaminophen or ibuprofen -- can help.  Sometimes intranasal glucocorticosteroids (like Flonase or Nasacort) help.  I have prescribed Ipratropium Bromide nasal spray 0.03% two sprays in each nostril 2-3 times a day   Another possible answer is that your ear is full of wax or cerumen. You may want to try some over the counter ear drops as well to help loosen and wax (Debrox) is a good option.  GET HELP RIGHT AWAY IF: Fever is over 102.2 degrees. You develop progressive ear pain or hearing loss. Ear symptoms persist longer than 3 days after treatment.  MAKE SURE YOU: Understand these instructions. Will watch your condition. Will get help right away if you are not doing well or get worse.  Thank you for choosing an e-visit.  Your e-visit answers were reviewed by a board certified advanced clinical practitioner to complete your personal care plan. Depending upon the condition, your plan could have included both over the counter or prescription medications.  Please review your pharmacy choice. Make sure the pharmacy is open so you can pick up the prescription now. If there is a problem, you may contact your provider through [employer] and have the prescription routed to another pharmacy.  Your safety is important to [place]. If you have  drug allergies check your prescription carefully.   For the next 24 hours you can use MyChart to ask questions about today's visit, request a non-urgent call back, or ask for a work or school excuse. You will get an email with a survey after  your eVisit asking about your experience. We would appreciate your feedback. I hope that your e-visit has been valuable and will aid in your recovery.  Meds ordered this encounter  Medications   ipratropium (ATROVENT) 0.03 % nasal spray    Sig: Place 2 sprays into both nostrils every 12 (twelve) hours.    Dispense:  30 mL    Refill:  12     I spent approximately 5 minutes reviewing the patient's history, current symptoms and coordinating their care today.

## 2022-12-26 ENCOUNTER — Encounter: Payer: Self-pay | Admitting: *Deleted

## 2023-01-05 ENCOUNTER — Encounter: Payer: Self-pay | Admitting: Neurology

## 2023-01-05 ENCOUNTER — Ambulatory Visit (INDEPENDENT_AMBULATORY_CARE_PROVIDER_SITE_OTHER): Payer: BC Managed Care – PPO | Admitting: Neurology

## 2023-01-05 VITALS — BP 124/86 | HR 68 | Ht 69.0 in | Wt 217.0 lb

## 2023-01-05 DIAGNOSIS — R0683 Snoring: Secondary | ICD-10-CM | POA: Diagnosis not present

## 2023-01-05 DIAGNOSIS — Z9189 Other specified personal risk factors, not elsewhere classified: Secondary | ICD-10-CM

## 2023-01-05 DIAGNOSIS — R0681 Apnea, not elsewhere classified: Secondary | ICD-10-CM | POA: Diagnosis not present

## 2023-01-05 DIAGNOSIS — G479 Sleep disorder, unspecified: Secondary | ICD-10-CM

## 2023-01-05 DIAGNOSIS — R519 Headache, unspecified: Secondary | ICD-10-CM

## 2023-01-05 DIAGNOSIS — G4719 Other hypersomnia: Secondary | ICD-10-CM | POA: Diagnosis not present

## 2023-01-05 DIAGNOSIS — F513 Sleepwalking [somnambulism]: Secondary | ICD-10-CM

## 2023-01-05 DIAGNOSIS — E669 Obesity, unspecified: Secondary | ICD-10-CM

## 2023-01-05 DIAGNOSIS — G478 Other sleep disorders: Secondary | ICD-10-CM

## 2023-01-05 NOTE — Patient Instructions (Signed)

## 2023-01-05 NOTE — Progress Notes (Signed)
Subjective:    Patient ID: Jimmy Barrett is a 37 y.o. male.  HPI    Jimmy Foley, MD, PhD [facility] [address], [address] [address] [place], [place] [address]  Dear Dr. Carmelia Barrett,  I saw your patient, Jimmy Barrett, upon your kind request in my sleep clinic today for initial consultation of his sleep disorder, in particular, concern for underlying obstructive sleep apnea.  The patient is unaccompanied today.  As you know, Jimmy Barrett is a 37 year old male with an underlying medical history of nonalcoholic fatty liver disease and mild obesity, who reports snoring and excessive daytime somnolence, as well as witnessed apneas per wife's report (she is an EMT).  His Epworth sleepiness score is 11/24, fatigue severity score is 36/63. I reviewed your office note from 12/07/2022.  He lives with his wife, he has 2 grown stepchildren, ages 34 and 70.  They have 1 dog in the household, the dog typically sleeps on the bed with them.  The TV is on at night but on a sleep timer.  He reports that the TV tends to relax him.  He has some trouble falling asleep which is not a new problem.  Sometimes he takes ZzzQuil at night.  He works as a Education officer, community for [employer].  Bedtime is usually around 9 PM, rise time around 3 AM if he has to go to work at 4.  If he does not have to go to work early or if he is off, he will wake up around 7:30 AM or 8 AM.  He denies nightly nocturia but has had occasional morning headaches.  He is not aware of any family history of sleep apnea.  He is a restless sleeper and has a history of sleepwalking and sleep talking, unclear how long, reports that he has been a restless sleeper since he was in the [employer], he is not sure if he was a sleepwalker sleep talker as a child also.  He has gained about 40 pounds over the past 15+ years.  He is a known smoker, he drinks caffeine in the form of soda, every other day.  He drinks alcohol  rarely.  His Past Medical History Is Significant For: Past Medical History:  Diagnosis Date   NAFLD (nonalcoholic fatty liver disease)     His Past Surgical History Is Significant For: Past Surgical History:  Procedure Laterality Date   OTHER SURGICAL HISTORY     Shrapnel removed from leg in [place]    His Family History Is Significant For: Family History  Problem Relation Age of Onset   Cancer Father        passed away from colon Cancer   Sleep apnea Neg Hx     His Social History Is Significant For: Social History   Socioeconomic History   Marital status: Married    Spouse name: Not on file   Number of children: Not on file   Years of education: Not on file   Highest education level: Not on file  Occupational History   Not on file  Tobacco Use   Smoking status: Never   Smokeless tobacco: Never  Vaping Use   Vaping Use: Never used  Substance and Sexual Activity   Alcohol use: Yes    Comment: socially   Drug use: No   Sexual activity: Yes    Partners: Female  Other Topics Concern   Not on file  Social History Narrative   Not  on file   Social Determinants of Health   Financial Resource Strain: Not on file  Food Insecurity: Not on file  Transportation Needs: Not on file  Physical Activity: Not on file  Stress: Not on file  Social Connections: Not on file    His Allergies Are:  No Known Allergies:   His Current Medications Are:  Outpatient Encounter Medications as of 01/05/2023  Medication Sig   Cinnamon 500 MG capsule Take 500 mg by mouth daily.   fenofibrate (TRICOR) 145 MG tablet TAKE 1 TABLET BY MOUTH DAILY.   ipratropium (ATROVENT) 0.03 % nasal spray Place 2 sprays into both nostrils every 12 (twelve) hours.   L-Tyrosine 500 MG CAPS Take 1 capsule by mouth daily.   Multiple Vitamin (MULTIVITAMIN PO) Take by mouth.   Omega-3 Fatty Acids (FISH OIL PO) Take by mouth.   rosuvastatin (CRESTOR) 20 MG tablet Take 1 tablet (20 mg total) by mouth  daily.   No facility-administered encounter medications on file as of 01/05/2023.  :   Review of Systems:  Out of a complete 14 point review of systems, all are reviewed and negative with the exception of these symptoms as listed below:  Review of Systems  Neurological:        Pt here for sleep consult Pt snores,fatigue,some headaches Pt denies hypertension, sleep study,CPAP machine   ESS: FSS:    Objective:  Neurological Exam  Physical Exam Physical Examination:   Vitals:   01/05/23 1334  BP: 124/86  Pulse: 68    General Examination: The patient is a very pleasant 37 y.o. male in no acute distress. He appears well-developed and well-nourished and very well groomed.   HEENT: Normocephalic, atraumatic, pupils are equal, round and reactive to light, extraocular tracking is good without limitation to gaze excursion or nystagmus noted. Hearing is grossly intact. Face is symmetric with normal facial animation. Speech is clear with no dysarthria noted. There is no hypophonia. There is no lip, neck/head, jaw or voice tremor. Neck is supple with full range of passive and active motion. There are no carotid bruits on auscultation. Oropharynx exam reveals: mild mouth dryness, adequate dental hygiene and moderate airway crowding, due to smaller airway entry, wider uvula, tonsils of 1+, right side slightly bigger. Mallampati is class II. Tongue protrudes centrally and palate elevates symmetrically. Neck size is 17.25 inches. He has a minimal overbite.   Chest: Clear to auscultation without wheezing, rhonchi or crackles noted.  Heart: S1+S2+0, regular and normal without murmurs, rubs or gallops noted.   Abdomen: Soft, non-tender and non-distended.  Extremities: There is no obvious edema in the distal lower extremities bilaterally.   Skin: Warm and dry without trophic changes noted.   Musculoskeletal: exam reveals no obvious joint deformities.   Neurologically:  Mental status: The  patient is awake, alert and oriented in all 4 spheres. His immediate and remote memory, attention, language skills and fund of knowledge are appropriate. There is no evidence of aphasia, agnosia, apraxia or anomia. Speech is clear with normal prosody and enunciation. Thought process is linear. Mood is normal and affect is normal.  Cranial nerves II - XII are as described above under HEENT exam.  Motor exam: Normal bulk, strength and tone is noted. There is no obvious action or resting tremor.  Fine motor skills and coordination: grossly intact.  Cerebellar testing: No dysmetria or intention tremor. There is no truncal or gait ataxia.  Sensory exam: intact to light touch in the upper and lower  extremities.  Gait, station and balance: He stands easily. No veering to one side is noted. No leaning to one side is noted. Posture is age-appropriate and stance is narrow based. Gait shows normal stride length and normal pace. No problems turning are noted.   Assessment and Plan:  In summary, Jimmy Barrett is a very pleasant 37 y.o.-year old male with an underlying medical history of nonalcoholic fatty liver disease and mild obesity, whose history and physical exam are concerning for sleep disordered breathing, particularly obstructive sleep apnea (OSA). A laboratory attended sleep study is typically considered "gold standard" for evaluation of sleep disordered breathing.   I had a long chat with the patien about my findings and the diagnosis of sleep apnea, particularly OSA, its prognosis and treatment options. We talked about medical/conservative treatments, surgical interventions and non-pharmacological approaches for symptom control. I explained, in particular, the risks and ramifications of untreated moderate to severe OSA, especially with respect to developing cardiovascular disease down the road, including congestive heart failure (CHF), difficult to treat hypertension, cardiac arrhythmias (particularly  A-fib), neurovascular complications including TIA, stroke and dementia. Even type 2 diabetes has, in part, been linked to untreated OSA. Symptoms of untreated OSA may include (but may not be limited to) daytime sleepiness, nocturia (i.e. frequent nighttime urination), memory problems, mood irritability and suboptimally controlled or worsening mood disorder such as depression and/or anxiety, lack of energy, lack of motivation, physical discomfort, as well as recurrent headaches, especially morning or nocturnal headaches. We talked about the importance of maintaining a healthy lifestyle and striving for healthy weight.  In addition, we talked about the importance of striving for and maintaining good sleep hygiene. I recommended a sleep study at this time. I outlined the differences between a laboratory attended sleep study which is considered more comprehensive and accurate over the option of a home sleep test (HST); the latter may lead to underestimation of sleep disordered breathing in some instances and does not help with diagnosing upper airway resistance syndrome and is not accurate enough to diagnose primary central sleep apnea typically. I outlined possible surgical and non-surgical treatment options of OSA, including the use of a positive airway pressure (PAP) device (i.e. CPAP, AutoPAP/APAP or BiPAP in certain circumstances), a custom-made dental device (aka oral appliance, which would require a referral to a specialist dentist or orthodontist typically, and is generally speaking not considered for patients with full dentures or edentulous state), upper airway surgical options, such as traditional UPPP (which is not considered a first-line treatment) or the Inspire device (hypoglossal nerve stimulator, which would involve a referral for consultation with an ENT surgeon, after careful selection, following inclusion criteria - also not first-line treatment). I explained the PAP treatment option to the patient  in detail, as this is generally considered first-line treatment.  The patient indicated that he would be willing to try PAP therapy, if the need arises. I explained the importance of being compliant with PAP treatment, not only for insurance purposes but primarily to improve patient's symptoms symptoms, and for the patient's long term health benefit, including to reduce His cardiovascular risks longer-term.    We will pick up our discussion about the next steps and treatment options after testing.  We will keep him posted as to the test results by phone call and/or MyChart messaging where possible.  We will plan to follow-up in sleep clinic accordingly as well.  I answered all his questions today and the patient was in agreement.   I  encouraged him to call with any interim questions, concerns, problems or updates or email [place] through MyChart.  Generally speaking, sleep test authorizations may take up to 2 weeks, sometimes less, sometimes longer, the patient is encouraged to get in touch with [place] if they do not hear back from the sleep lab staff directly within the next 2 weeks.  Thank you very much for allowing me to participate in the care of this nice patient. If I can be of any further assistance to you please do not hesitate to call me at [phone].  Sincerely,   Jimmy Foley, MD, PhD

## 2023-01-06 ENCOUNTER — Telehealth: Payer: BC Managed Care – PPO | Admitting: Family Medicine

## 2023-01-06 DIAGNOSIS — J40 Bronchitis, not specified as acute or chronic: Secondary | ICD-10-CM | POA: Diagnosis not present

## 2023-01-06 MED ORDER — BENZONATATE 200 MG PO CAPS
200.0000 mg | ORAL_CAPSULE | Freq: Two times a day (BID) | ORAL | 0 refills | Status: DC | PRN
  Filled 2023-01-06 – 2023-01-07 (×2): qty 20, 10d supply, fill #0

## 2023-01-06 MED ORDER — AZITHROMYCIN 250 MG PO TABS
ORAL_TABLET | ORAL | 0 refills | Status: AC
  Filled 2023-01-06 – 2023-01-07 (×2): qty 6, 5d supply, fill #0

## 2023-01-06 NOTE — Progress Notes (Signed)

## 2023-01-07 ENCOUNTER — Other Ambulatory Visit (HOSPITAL_COMMUNITY): Payer: Self-pay

## 2023-02-02 ENCOUNTER — Other Ambulatory Visit (INDEPENDENT_AMBULATORY_CARE_PROVIDER_SITE_OTHER): Payer: BC Managed Care – PPO

## 2023-02-02 ENCOUNTER — Telehealth: Payer: Self-pay | Admitting: Neurology

## 2023-02-02 DIAGNOSIS — E782 Mixed hyperlipidemia: Secondary | ICD-10-CM | POA: Diagnosis not present

## 2023-02-02 NOTE — Telephone Encounter (Signed)
[employer]- [employer] no auth req spoke to Jimmy Barrett ref # [id number]    Patient is scheduled at [facility] for 04/18/23 at 8 pm.  Mailed packet to the patient.

## 2023-02-03 ENCOUNTER — Encounter: Payer: Self-pay | Admitting: Family Medicine

## 2023-02-03 LAB — LIPID PANEL
Cholesterol: 103 mg/dL (ref 0–200)
HDL: 36.3 mg/dL — ABNORMAL LOW (ref >39.00)
LDL Cholesterol: 42 mg/dL (ref 0–99)
NonHDL: 67.09
Total CHOL/HDL Ratio: 3
Triglycerides: 126 mg/dL (ref 0.0–149.0)
VLDL: 25.2 mg/dL (ref 0.0–40.0)

## 2023-03-01 ENCOUNTER — Other Ambulatory Visit (HOSPITAL_COMMUNITY): Payer: Self-pay

## 2023-04-27 ENCOUNTER — Encounter (INDEPENDENT_AMBULATORY_CARE_PROVIDER_SITE_OTHER): Payer: Self-pay

## 2023-05-19 ENCOUNTER — Other Ambulatory Visit (HOSPITAL_COMMUNITY): Payer: Self-pay

## 2023-05-22 ENCOUNTER — Other Ambulatory Visit (HOSPITAL_COMMUNITY): Payer: Self-pay

## 2023-06-12 ENCOUNTER — Other Ambulatory Visit: Payer: Self-pay | Admitting: Family Medicine

## 2023-06-12 ENCOUNTER — Encounter: Payer: Self-pay | Admitting: Family Medicine

## 2023-06-12 ENCOUNTER — Ambulatory Visit: Payer: BC Managed Care – PPO | Admitting: Family Medicine

## 2023-06-12 ENCOUNTER — Other Ambulatory Visit (HOSPITAL_COMMUNITY): Payer: Self-pay

## 2023-06-12 VITALS — BP 120/80 | HR 70 | Temp 98.7°F | Ht 68.0 in | Wt 214.4 lb

## 2023-06-12 DIAGNOSIS — Z23 Encounter for immunization: Secondary | ICD-10-CM | POA: Diagnosis not present

## 2023-06-12 DIAGNOSIS — E782 Mixed hyperlipidemia: Secondary | ICD-10-CM | POA: Diagnosis not present

## 2023-06-12 LAB — COMPREHENSIVE METABOLIC PANEL
ALT: 69 U/L — ABNORMAL HIGH (ref 0–53)
AST: 32 U/L (ref 0–37)
Albumin: 4.6 g/dL (ref 3.5–5.2)
Alkaline Phosphatase: 52 U/L (ref 39–117)
BUN: 16 mg/dL (ref 6–23)
CO2: 27 meq/L (ref 19–32)
Calcium: 9.6 mg/dL (ref 8.4–10.5)
Chloride: 104 meq/L (ref 96–112)
Creatinine, Ser: 0.9 mg/dL (ref 0.40–1.50)
GFR: 108.9 mL/min (ref >60.00)
Glucose, Bld: 90 mg/dL (ref 70–99)
Potassium: 4 meq/L (ref 3.5–5.1)
Sodium: 140 meq/L (ref 135–145)
Total Bilirubin: 1 mg/dL (ref 0.2–1.2)
Total Protein: 7.1 g/dL (ref 6.0–8.3)

## 2023-06-12 LAB — LIPID PANEL
Cholesterol: 164 mg/dL (ref 0–200)
HDL: 36.6 mg/dL — ABNORMAL LOW (ref >39.00)
LDL Cholesterol: 94 mg/dL (ref 0–99)
NonHDL: 126.92
Total CHOL/HDL Ratio: 4
Triglycerides: 166 mg/dL — ABNORMAL HIGH (ref 0.0–149.0)
VLDL: 33.2 mg/dL (ref 0.0–40.0)

## 2023-06-12 MED ORDER — TRIAMCINOLONE ACETONIDE 0.1 % EX CREA
1.0000 | TOPICAL_CREAM | Freq: Two times a day (BID) | CUTANEOUS | 0 refills | Status: DC
  Filled 2023-06-12: qty 30, 15d supply, fill #0

## 2023-06-12 NOTE — Progress Notes (Signed)
Chief Complaint  Patient presents with   Follow-up    Subjective: Hyperlipidemia Patient presents for mixed hyperlipidemia follow up. Currently taking Crestor 20 mg/d, Tricor 145 mg/d and compliance with treatment thus far has been good. He denies myalgias. He is adhering to a healthy diet. Exercise: walking, lifting wts No Cp or SOB.  The patient is not known to have coexisting coronary artery disease.  Past Medical History:  Diagnosis Date   NAFLD (nonalcoholic fatty liver disease)     Objective: BP 120/80 (BP Location: Left Arm, Patient Position: Sitting, Cuff Size: Normal)   Pulse 70   Temp 98.7 F (37.1 C) (Oral)   Ht 5\' 8"  (1.727 m)   Wt 214 lb 6 oz (97.2 kg)   SpO2 99%   BMI 32.60 kg/m  General: Awake, appears stated age HEENT: MMM Heart: RRR, no LE edema, no bruits Lungs: CTAB, no rales, wheezes or rhonchi. No accessory muscle use Psych: Age appropriate judgment and insight, normal affect and mood  Assessment and Plan: Mixed hyperlipidemia - Plan: Comprehensive metabolic panel, Lipid panel  Need for influenza vaccination - Plan: Flu vaccine trivalent PF, 6mos and older(Flulaval,Afluria,Fluarix,Fluzone)  Chronic, stable. Cont Crestor 20 mg/d, Tricor 145 mg/d. Counseled on diet/exercise.  F/u in 6 mo for CPE or prn.  The patient voiced understanding and agreement to the plan.  Jilda Roche [place], DO 06/12/23  8:45 AM

## 2023-06-12 NOTE — Patient Instructions (Signed)
Give us 2-3 business days to get the results of your labs back.   Keep the diet clean and stay active.  Let us know if you need anything. 

## 2023-07-09 ENCOUNTER — Other Ambulatory Visit: Payer: Self-pay | Admitting: Family Medicine

## 2023-07-10 ENCOUNTER — Other Ambulatory Visit (HOSPITAL_COMMUNITY): Payer: Self-pay

## 2023-07-10 MED ORDER — FENOFIBRATE 145 MG PO TABS
ORAL_TABLET | Freq: Every day | ORAL | 3 refills | Status: DC
  Filled 2023-07-10: qty 30, 30d supply, fill #0
  Filled 2023-09-08: qty 30, 30d supply, fill #1
  Filled 2023-10-14: qty 30, 30d supply, fill #2

## 2023-07-19 ENCOUNTER — Other Ambulatory Visit (HOSPITAL_COMMUNITY): Payer: Self-pay

## 2023-07-24 ENCOUNTER — Other Ambulatory Visit (INDEPENDENT_AMBULATORY_CARE_PROVIDER_SITE_OTHER): Payer: BC Managed Care – PPO

## 2023-07-24 DIAGNOSIS — E782 Mixed hyperlipidemia: Secondary | ICD-10-CM | POA: Diagnosis not present

## 2023-07-24 LAB — LIPID PANEL
Cholesterol: 125 mg/dL (ref 0–200)
HDL: 34.9 mg/dL — ABNORMAL LOW (ref >39.00)
LDL Cholesterol: 64 mg/dL (ref 0–99)
NonHDL: 90.29
Total CHOL/HDL Ratio: 4
Triglycerides: 132 mg/dL (ref 0.0–149.0)
VLDL: 26.4 mg/dL (ref 0.0–40.0)

## 2023-10-14 ENCOUNTER — Telehealth: Payer: BC Managed Care – PPO | Admitting: Family Medicine

## 2023-10-14 DIAGNOSIS — J40 Bronchitis, not specified as acute or chronic: Secondary | ICD-10-CM

## 2023-10-14 MED ORDER — BENZONATATE 200 MG PO CAPS
200.0000 mg | ORAL_CAPSULE | Freq: Two times a day (BID) | ORAL | 0 refills | Status: DC | PRN
  Filled 2023-10-14: qty 20, 10d supply, fill #0

## 2023-10-14 MED ORDER — AZITHROMYCIN 250 MG PO TABS
ORAL_TABLET | ORAL | 0 refills | Status: AC
  Filled 2023-10-14: qty 6, 5d supply, fill #0

## 2023-10-14 NOTE — Progress Notes (Signed)

## 2023-10-15 MED ORDER — ALBUTEROL SULFATE HFA 108 (90 BASE) MCG/ACT IN AERS: 2.0000 | INHALATION_SPRAY | Freq: Four times a day (QID) | RESPIRATORY_TRACT | 2 refills | Status: AC | PRN

## 2023-10-15 NOTE — Progress Notes (Signed)
 I have spent 5 minutes in review of e-visit questionnaire, review and updating patient chart, medical decision making and response to patient.   Claiborne Rigg, NP

## 2023-10-16 ENCOUNTER — Other Ambulatory Visit (HOSPITAL_COMMUNITY): Payer: Self-pay

## 2023-12-01 ENCOUNTER — Other Ambulatory Visit (HOSPITAL_BASED_OUTPATIENT_CLINIC_OR_DEPARTMENT_OTHER): Payer: Self-pay

## 2023-12-01 ENCOUNTER — Telehealth: Admitting: Physician Assistant

## 2023-12-01 DIAGNOSIS — J301 Allergic rhinitis due to pollen: Secondary | ICD-10-CM | POA: Diagnosis not present

## 2023-12-01 MED ORDER — CETIRIZINE HCL 10 MG PO TABS
10.0000 mg | ORAL_TABLET | Freq: Every day | ORAL | 0 refills | Status: DC
  Filled 2023-12-01 – 2023-12-12 (×2): qty 30, 30d supply, fill #0

## 2023-12-01 MED ORDER — FLUTICASONE PROPIONATE 50 MCG/ACT NA SUSP
2.0000 | Freq: Every day | NASAL | 0 refills | Status: DC
  Filled 2023-12-01 – 2023-12-12 (×2): qty 16, 30d supply, fill #0

## 2023-12-01 NOTE — Progress Notes (Signed)
 E visit for Allergic Rhinitis We are sorry that you are not feeling well.  Here is how we plan to help!  Based on what you have shared with me it looks like you have Allergic Rhinitis.  Rhinitis is when a reaction occurs that causes nasal congestion, runny nose, sneezing, and itching.  Most types of rhinitis are caused by an inflammation and are associated with symptoms in the eyes ears or throat. There are several types of rhinitis.  The most common are acute rhinitis, which is usually caused by a viral illness, allergic or seasonal rhinitis, and nonallergic or year-round rhinitis.  Nasal allergies occur certain times of the year.  Allergic rhinitis is caused when allergens in the air trigger the release of histamine in the body.  Histamine causes itching, swelling, and fluid to build up in the fragile linings of the nasal passages, sinuses and eyelids.  An itchy nose and clear discharge are common.  I recommend the following over the counter treatments: You should take a daily dose of antihistamine: I have prescribed Cetirizine 10mg  Take 1 tablet daily for 30 days  I also would recommend a nasal spray: I have prescribed: Flonase 2 sprays into each nostril once daily for 10-14 days  We are unable to prescribe Steroid shots through the Virtual Urgent Care Department. Also, steroids do help with inflammation, but lower your immune system, which could make you more susceptible to catch other infections.  We would recommend to use Vit D, Vit C, and Zinc to help boost your immune system. These can be taken for 2-4 weeks to boost immunity if desired.  Vit C 500mg  twice daily Zinc 75-100mg  daily Vit D3 1000-2000 IU daily  HOME CARE:  You can use an over-the-counter saline nasal spray as needed Avoid areas where there is heavy dust, mites, or molds Stay indoors on windy days during the pollen season Keep windows closed in home, at least in bedroom; use air conditioner. Use high-efficiency house  air filter Keep windows closed in car, turn AC on re-circulate Avoid playing out with dog during pollen season  GET HELP RIGHT AWAY IF:  If your symptoms do not improve within 10 days You become short of breath You develop yellow or green discharge from your nose for over 3 days You have coughing fits  MAKE SURE YOU:  Understand these instructions Will watch your condition Will get help right away if you are not doing well or get worse  Thank you for choosing an e-visit. Your e-visit answers were reviewed by a board certified advanced clinical practitioner to complete your personal care plan. Depending upon the condition, your plan could have included both over the counter or prescription medications. Please review your pharmacy choice. Be sure that the pharmacy you have chosen is open so that you can pick up your prescription now.  If there is a problem you may message your provider in MyChart to have the prescription routed to another pharmacy. Your safety is important to [place]. If you have drug allergies check your prescription carefully.  For the next 24 hours, you can use MyChart to ask questions about today's visit, request a non-urgent call back, or ask for a work or school excuse from your e-visit provider. You will get an email in the next two days asking about your experience. I hope that your e-visit has been valuable and will speed your recovery.      I have spent 5 minutes in review of e-visit questionnaire,  review and updating patient chart, medical decision making and response to patient.   Margaretann Loveless, PA-C

## 2023-12-11 ENCOUNTER — Encounter: Payer: Self-pay | Admitting: Family Medicine

## 2023-12-11 ENCOUNTER — Ambulatory Visit (INDEPENDENT_AMBULATORY_CARE_PROVIDER_SITE_OTHER): Payer: BC Managed Care – PPO | Admitting: Family Medicine

## 2023-12-11 ENCOUNTER — Other Ambulatory Visit (HOSPITAL_BASED_OUTPATIENT_CLINIC_OR_DEPARTMENT_OTHER): Payer: Self-pay

## 2023-12-11 VITALS — BP 118/74 | HR 70 | Ht 68.0 in | Wt 217.4 lb

## 2023-12-11 DIAGNOSIS — Z0000 Encounter for general adult medical examination without abnormal findings: Secondary | ICD-10-CM | POA: Diagnosis not present

## 2023-12-11 LAB — COMPREHENSIVE METABOLIC PANEL WITH GFR
ALT: 60 U/L — ABNORMAL HIGH (ref 0–53)
AST: 29 U/L (ref 0–37)
Albumin: 4.9 g/dL (ref 3.5–5.2)
Alkaline Phosphatase: 59 U/L (ref 39–117)
BUN: 15 mg/dL (ref 6–23)
CO2: 27 meq/L (ref 19–32)
Calcium: 9.7 mg/dL (ref 8.4–10.5)
Chloride: 104 meq/L (ref 96–112)
Creatinine, Ser: 0.93 mg/dL (ref 0.40–1.50)
GFR: 104.33 mL/min (ref >60.00)
Glucose, Bld: 85 mg/dL (ref 70–99)
Potassium: 4.2 meq/L (ref 3.5–5.1)
Sodium: 141 meq/L (ref 135–145)
Total Bilirubin: 0.7 mg/dL (ref 0.2–1.2)
Total Protein: 7.6 g/dL (ref 6.0–8.3)

## 2023-12-11 LAB — LIPID PANEL
Cholesterol: 134 mg/dL (ref 0–200)
HDL: 34 mg/dL — ABNORMAL LOW (ref >39.00)
LDL Cholesterol: 55 mg/dL (ref 0–99)
NonHDL: 100.31
Total CHOL/HDL Ratio: 4
Triglycerides: 227 mg/dL — ABNORMAL HIGH (ref 0.0–149.0)
VLDL: 45.4 mg/dL — ABNORMAL HIGH (ref 0.0–40.0)

## 2023-12-11 LAB — CBC
HCT: 43.7 % (ref 39.0–52.0)
Hemoglobin: 15.2 g/dL (ref 13.0–17.0)
MCHC: 34.9 g/dL (ref 30.0–36.0)
MCV: 89.2 fl (ref 78.0–100.0)
Platelets: 357 10*3/uL (ref 150.0–400.0)
RBC: 4.9 Mil/uL (ref 4.22–5.81)
RDW: 12.6 % (ref 11.5–15.5)
WBC: 8.5 10*3/uL (ref 4.0–10.5)

## 2023-12-11 MED ORDER — FENOFIBRATE 145 MG PO TABS
145.0000 mg | ORAL_TABLET | Freq: Every day | ORAL | 3 refills | Status: DC
  Filled 2023-12-11: qty 30, 30d supply, fill #0
  Filled 2024-01-26: qty 30, 30d supply, fill #1
  Filled 2024-02-27: qty 30, 30d supply, fill #2
  Filled 2024-04-05: qty 30, 30d supply, fill #3
  Filled 2024-05-09: qty 30, 30d supply, fill #4
  Filled 2024-06-10: qty 30, 30d supply, fill #5

## 2023-12-11 NOTE — Progress Notes (Signed)
 Chief Complaint  Patient presents with   Annual Exam    Patient presents today for physical exam.    Well Male Jimmy Barrett is here for a complete physical.   His last physical was >1 year ago.  Current diet: in general, a "healthy" diet.   Current exercise: walking, lifting wts Weight trend: stable Fatigue out of ordinary? No. Seat belt? Yes.   Advanced directive? No  Health maintenance Tetanus- Yes HIV- Yes Hep C- Yes  Past Medical History:  Diagnosis Date   NAFLD (nonalcoholic fatty liver disease)      Past Surgical History:  Procedure Laterality Date   OTHER SURGICAL HISTORY     Shrapnel removed from leg in [place]    Medications  Current Outpatient Medications on File Prior to Visit  Medication Sig Dispense Refill   albuterol (VENTOLIN HFA) 108 (90 Base) MCG/ACT inhaler Inhale 2 puffs into the lungs every 6 (six) hours as needed for wheezing or shortness of breath. 8 g 2   cetirizine (ZYRTEC) 10 MG tablet Take 1 tablet (10 mg total) by mouth daily. 30 tablet 0   Cinnamon 500 MG capsule Take 500 mg by mouth daily.     fluticasone (FLONASE) 50 MCG/ACT nasal spray Place 2 sprays into both nostrils daily. 16 g 0   L-Tyrosine 500 MG CAPS Take 1 capsule by mouth daily.     Multiple Vitamin (MULTIVITAMIN PO) Take by mouth.     Omega-3 Fatty Acids (FISH OIL PO) Take by mouth.     rosuvastatin (CRESTOR) 20 MG tablet Take 1 tablet (20 mg total) by mouth daily. 90 tablet 3   triamcinolone cream (KENALOG) 0.1 % Apply to affected area 2 (two) times daily as directed. 30 g 0    Allergies No Known Allergies  Family History Family History  Problem Relation Age of Onset   Cancer Father        passed away from colon Cancer   Sleep apnea Neg Hx     Review of Systems: Constitutional: no fevers or chills Eye:  no recent significant change in vision Ear/Nose/Mouth/Throat:  Ears:  no hearing loss Nose/Mouth/Throat:  no complaints of nasal congestion, no sore  throat Cardiovascular:  no chest pain Respiratory:  no shortness of breath Gastrointestinal:  no abdominal pain, no change in bowel habits GU:  Male: negative for dysuria Musculoskeletal/Extremities:  no pain of the joints Integumentary (Skin/Breast):  no abnormal skin lesions reported Neurologic:  no headaches Endocrine: No unexpected weight changes Hematologic/Lymphatic:  no night sweats  Exam BP 118/74   Pulse 70   Ht 5\' 8"  (1.727 m)   Wt 217 lb 6.4 oz (98.6 kg)   SpO2 98%   BMI 33.06 kg/m  General:  well developed, well nourished, in no apparent distress Skin:  no significant moles, warts, or growths Head:  no masses, lesions, or tenderness Eyes:  pupils equal and round, sclera anicteric without injection Ears:  canals without lesions, TMs shiny without retraction, no obvious effusion, no erythema Nose:  nares patent, mucosa normal Throat/Pharynx:  lips and gingiva without lesion; tongue and uvula midline; non-inflamed pharynx; no exudates or postnasal drainage Neck: neck supple without adenopathy, thyromegaly, or masses Lungs:  clear to auscultation, breath sounds equal bilaterally, no respiratory distress Cardio:  regular rate and rhythm, no bruits, no LE edema Abdomen:  abdomen soft, nontender; bowel sounds normal; no masses or organomegaly Genital (male): Deferred Rectal: Deferred Musculoskeletal:  symmetrical muscle groups noted without atrophy or deformity  Extremities:  no clubbing, cyanosis, or edema, no deformities, no skin discoloration Neuro:  gait normal; deep tendon reflexes normal and symmetric Psych: well oriented with normal range of affect and appropriate judgment/insight  Assessment and Plan  Well adult exam - Plan: CBC, Comprehensive metabolic panel with GFR, Lipid panel   Well 38 y.o. male. Counseled on diet and exercise. Self testicular exams recommended at least monthly.  Advanced directive form requested today.  Other orders as above. Follow up  in 6 mo pending the above workup. The patient voiced understanding and agreement to the plan.  Jilda Roche [place], DO 12/11/23 10:12 AM

## 2023-12-11 NOTE — Patient Instructions (Addendum)
 Give [place] 2-3 business days to get the results of your labs back.   Keep the diet clean and stay active.  Do monthly self testicular checks in the shower. You are feeling for lumps/bumps that don't belong. If you feel anything like this, let me know!  Please get me a copy of your advanced directive form at your convenience.   Let us know if you need anything.

## 2023-12-12 ENCOUNTER — Other Ambulatory Visit (HOSPITAL_BASED_OUTPATIENT_CLINIC_OR_DEPARTMENT_OTHER): Payer: Self-pay

## 2023-12-12 ENCOUNTER — Other Ambulatory Visit: Payer: Self-pay

## 2023-12-12 DIAGNOSIS — E782 Mixed hyperlipidemia: Secondary | ICD-10-CM

## 2023-12-14 ENCOUNTER — Other Ambulatory Visit: Payer: Self-pay | Admitting: Family Medicine

## 2023-12-14 ENCOUNTER — Other Ambulatory Visit (HOSPITAL_BASED_OUTPATIENT_CLINIC_OR_DEPARTMENT_OTHER): Payer: Self-pay

## 2023-12-14 MED ORDER — ROSUVASTATIN CALCIUM 20 MG PO TABS
20.0000 mg | ORAL_TABLET | Freq: Every day | ORAL | 1 refills | Status: DC
  Filled 2023-12-14: qty 30, 30d supply, fill #0
  Filled 2024-01-26: qty 30, 30d supply, fill #1
  Filled 2024-02-27: qty 30, 30d supply, fill #2
  Filled 2024-04-05: qty 30, 30d supply, fill #3
  Filled 2024-05-09: qty 30, 30d supply, fill #4
  Filled 2024-06-10: qty 30, 30d supply, fill #5

## 2023-12-18 ENCOUNTER — Encounter: Payer: Self-pay | Admitting: Family Medicine

## 2023-12-18 ENCOUNTER — Other Ambulatory Visit (HOSPITAL_BASED_OUTPATIENT_CLINIC_OR_DEPARTMENT_OTHER): Payer: Self-pay

## 2023-12-18 ENCOUNTER — Encounter (HOSPITAL_BASED_OUTPATIENT_CLINIC_OR_DEPARTMENT_OTHER): Payer: Self-pay

## 2023-12-18 ENCOUNTER — Ambulatory Visit (HOSPITAL_BASED_OUTPATIENT_CLINIC_OR_DEPARTMENT_OTHER): Admitting: Radiology

## 2023-12-18 ENCOUNTER — Ambulatory Visit (HOSPITAL_BASED_OUTPATIENT_CLINIC_OR_DEPARTMENT_OTHER)
Admission: RE | Admit: 2023-12-18 | Discharge: 2023-12-18 | Disposition: A | Discharge status: Home / Self Care | Source: Ambulatory Visit | Attending: Family Medicine | Admitting: Family Medicine

## 2023-12-18 VITALS — BP 143/87 | HR 75 | Temp 98.6°F | Resp 20

## 2023-12-18 DIAGNOSIS — R1010 Upper abdominal pain, unspecified: Secondary | ICD-10-CM

## 2023-12-18 LAB — POC HEMOCCULT BLD/STL (OFFICE/1-CARD/DIAGNOSTIC): Fecal Occult Blood, POC: NEGATIVE

## 2023-12-18 MED ORDER — OMEPRAZOLE 20 MG PO CPDR
20.0000 mg | DELAYED_RELEASE_CAPSULE | Freq: Every day | ORAL | 1 refills | Status: DC
  Filled 2023-12-18: qty 30, 30d supply, fill #0

## 2023-12-18 MED ORDER — SUCRALFATE 1 G PO TABS
1.0000 g | ORAL_TABLET | Freq: Three times a day (TID) | ORAL | 0 refills | Status: DC
  Filled 2023-12-18: qty 120, 30d supply, fill #0

## 2023-12-18 NOTE — Discharge Instructions (Addendum)
 I am treating you for a ulcer Take the medication as prescribed

## 2023-12-18 NOTE — ED Triage Notes (Signed)
 Patient states severe epigastric pain x 1 week, especially after eating. Reports black stool this morning. Denies heavy ibuprofen or aspirin use. Last po intake 14 hours ago. Wife is an EMT and thinks patient has a gallbladder problem. Has not attempted mylanta, tums, or pepto for discomfort.

## 2023-12-19 ENCOUNTER — Telehealth (INDEPENDENT_AMBULATORY_CARE_PROVIDER_SITE_OTHER): Admitting: Family Medicine

## 2023-12-19 ENCOUNTER — Encounter: Payer: Self-pay | Admitting: Family Medicine

## 2023-12-19 DIAGNOSIS — K921 Melena: Secondary | ICD-10-CM | POA: Diagnosis not present

## 2023-12-19 DIAGNOSIS — R1013 Epigastric pain: Secondary | ICD-10-CM

## 2023-12-19 DIAGNOSIS — N281 Cyst of kidney, acquired: Secondary | ICD-10-CM | POA: Diagnosis not present

## 2023-12-19 DIAGNOSIS — N2889 Other specified disorders of kidney and ureter: Secondary | ICD-10-CM | POA: Diagnosis not present

## 2023-12-19 NOTE — ED Provider Notes (Signed)
 [facility]    CSN: [id number] Arrival date & time: 12/18/23  1152      History   Chief Complaint Chief Complaint  Patient presents with   Abdominal Pain    Pain after eating, severe pain in upper part of stomach and on sides, stomach cramps - Entered by patient    HPI Jimmy Barrett is a 38 y.o. male.   38 year old male presents today with upper abdominal pain x 1 week.  Worse sometimes after eating.  Some nausea.  Had dark stool this morning.  The pain is constant.  Describes the pain as sharp and squeezing at times.  Denies any heavy alcohol or ibuprofen use.  Last ate 14 hours ago.  Has not tried anything over-the-counter for symptoms.  Denies any vomiting, diarrhea or hematemesis   Abdominal Pain   Past Medical History:  Diagnosis Date   NAFLD (nonalcoholic fatty liver disease)     Patient Active Problem List   Diagnosis Date Noted   NAFLD (nonalcoholic fatty liver disease)    Chronic pain of both knees 02/24/2020   Low back pain 02/24/2020    Past Surgical History:  Procedure Laterality Date   OTHER SURGICAL HISTORY     Shrapnel removed from leg in [place]       Home Medications    Prior to Admission medications   Medication Sig Start Date End Date Taking? Authorizing Provider  omeprazole (PRILOSEC) 20 MG capsule Take 1 capsule (20 mg total) by mouth daily. 12/18/23  Yes Draven Laine A, FNP  sucralfate (CARAFATE) 1 g tablet Take 1 tablet (1 g total) by mouth 4 (four) times daily -  with meals and at bedtime. 12/18/23  Yes Jazzlynn Rawe A, FNP  albuterol (VENTOLIN HFA) 108 (90 Base) MCG/ACT inhaler Inhale 2 puffs into the lungs every 6 (six) hours as needed for wheezing or shortness of breath. 10/15/23   Claiborne Rigg, NP  cetirizine (ZYRTEC) 10 MG tablet Take 1 tablet (10 mg total) by mouth daily. 12/01/23   Margaretann Loveless, PA-C  fenofibrate (TRICOR) 145 MG tablet Take 1 tablet (145 mg total) by mouth daily. 12/11/23   Sharlene Dory,  DO  fluticasone (FLONASE) 50 MCG/ACT nasal spray Place 2 sprays into both nostrils daily. 12/01/23   Margaretann Loveless, PA-C  L-Tyrosine 500 MG CAPS Take 1 capsule by mouth daily.    [provider]  Multiple Vitamin (MULTIVITAMIN PO) Take by mouth.    [provider]  Omega-3 Fatty Acids (FISH OIL PO) Take by mouth.    [provider]  rosuvastatin (CRESTOR) 20 MG tablet Take 1 tablet (20 mg total) by mouth daily. 12/14/23   Sharlene Dory, DO  triamcinolone cream (KENALOG) 0.1 % Apply to affected area 2 (two) times daily as directed. 06/12/23   Sharlene Dory, DO    Family History Family History  Problem Relation Age of Onset   Cancer Father        passed away from colon Cancer   Sleep apnea Neg Hx     Social History Social History   Tobacco Use   Smoking status: Never   Smokeless tobacco: Never  Vaping Use   Vaping status: Never Used  Substance Use Topics   Alcohol use: Yes    Comment: socially   Drug use: No     Allergies   Patient has no known allergies.   Review of Systems Review of Systems  Gastrointestinal:  Positive  for abdominal pain.     Physical Exam Triage Vital Signs ED Triage Vitals  Encounter Vitals Group     BP 12/18/23 1208 (!) 143/87     Systolic BP Percentile --      Diastolic BP Percentile --      Pulse Rate 12/18/23 1208 75     Resp 12/18/23 1208 20     Temp 12/18/23 1208 98.6 F (37 C)     Temp src --      SpO2 12/18/23 1208 96 %     Weight --      Height --      Head Circumference --      Peak Flow --      Pain Score 12/18/23 1211 9     Pain Loc --      Pain Education --      Exclude from Growth Chart --    No data found.  Updated Vital Signs BP (!) 143/87 (BP Location: Right Arm)   Pulse 75   Temp 98.6 F (37 C)   Resp 20   SpO2 96%   Visual Acuity Right Eye Distance:   Left Eye Distance:   Bilateral Distance:    Right Eye Near:   Left Eye Near:    Bilateral Near:      Physical Exam Constitutional:      Appearance: He is well-developed.  Pulmonary:     Effort: Pulmonary effort is normal.  Abdominal:     General: Bowel sounds are normal.     Palpations: Abdomen is soft.     Tenderness: There is abdominal tenderness in the right upper quadrant and epigastric area. There is guarding.  Genitourinary:    Rectum: Normal.  Skin:    General: Skin is warm and dry.  Neurological:     Mental Status: He is alert.      UC Treatments / Results  Labs (all labs ordered are listed, but only abnormal results are displayed) Labs Reviewed  POC HEMOCCULT BLD/STL (OFFICE/1-CARD/DIAGNOSTIC) - Normal    EKG   Radiology US Abdomen Limited RUQ (LIVER/GB) Result Date: 12/18/2023 CLINICAL DATA:  Epigastric pain EXAM: ULTRASOUND ABDOMEN LIMITED RIGHT UPPER QUADRANT COMPARISON:  Ultrasound 01/21/2021 FINDINGS: Gallbladder: No gallstones or wall thickening visualized. No sonographic Murphy sign noted by sonographer. Common bile duct: Diameter: 3 mm Liver: Diffusely echogenic hepatic parenchyma consistent with fatty liver infiltration. With this level of echogenicity evaluation for underlying mass lesion is limited and if needed follow-up contrast CT or MRI as clinically appropriate. Portal vein is patent on color Doppler imaging with normal direction of blood flow towards the liver. Other: Incidental note is made of right-sided renal cysts which appears simple by ultrasound measuring 2.7 cm and 2.6 cm. There is some echogenic foci as well in the right kidney measuring up to 4 mm. However these do not shadow. This could be interface artifact rather than stones. IMPRESSION: No gallstones or ductal dilatation. Fatty liver infiltration. Right-sided renal cysts. There is also 2 echogenic foci which do not shadow in the right kidney. Confirmation of etiology is recommended when clinically appropriate such as CT scan Electronically Signed   By: Karen Kays M.D.   On: 12/18/2023 14:37     Procedures Procedures (including critical care time)  Medications Ordered in UC Medications - No data to display  Initial Impression / Assessment and Plan / UC Course  I have reviewed the triage vital signs and the nursing notes.  Pertinent labs &  imaging results that were available during my care of the patient were reviewed by me and considered in my medical decision making (see chart for details).     Abdominal pain-ultrasound showed fatty liver which patient has known  history of.  Also showed some benign cyst on the kidneys.  No gallstones. Believe symptoms are related to gastric ulcer.  Prescribing carafate  and omeprazole x 30 days.  Recommend if symptoms continue to follow-up with a GI specialist. Information given on diet for gastric ulcer and recommendations. For worsening symptoms will need to go to the ER. Final Clinical Impressions(s) / UC Diagnoses   Final diagnoses:  Pain of upper abdomen     Discharge Instructions      I am treating you for a ulcer Take the medication as prescribed    ED Prescriptions     Medication Sig Dispense Auth. Provider   sucralfate (CARAFATE) 1 g tablet Take 1 tablet (1 g total) by mouth 4 (four) times daily -  with meals and at bedtime. 120 tablet Deandrea Vanpelt A, FNP   omeprazole (PRILOSEC) 20 MG capsule Take 1 capsule (20 mg total) by mouth daily. 30 capsule Janace Aris, FNP      PDMP not reviewed this encounter.   Janace Aris, FNP 12/19/23 [phone]

## 2023-12-19 NOTE — Progress Notes (Signed)
 Chief Complaint  Patient presents with   Acute Visit    Patient presents today for an emergency room follow-up for abdominal pain.    Subjective: Patient is a 38 y.o. male here for UC f/u. We are interacting via web portal for an electronic face-to-face visit. I verified patient's ID using 2 identifiers. Patient agreed to proceed with visit via this method. Patient is at home, I am at office. Patient and I are present for visit.   Patient has been having around 1 week of epigastric abdominal pain.  He went to urgent care last night and a right upper quadrant ultrasound was done.  It showed renal cysts and recommended contrast-enhanced CT follow-up.  It did show fatty infiltration which we already knew about.  He did not show any inflamed gallbladder.  He was placed on Carafate and omeprazole 20 mg daily.  He picked up the medication and started today.  He still having pain.  Eating seems to make it worse.  He did have an episode of melena 2 nights ago.  No abnormal bowel movements since then.  He reports having a rectal exam and the provider telling him that it was positive for blood.  Past Medical History:  Diagnosis Date   NAFLD (nonalcoholic fatty liver disease)     Objective: No conversational dyspnea Age appropriate judgment and insight Nml affect and mood  Assessment and Plan: Renal cyst  Other specified disorders of kidney and ureter - Plan: CT ABDOMEN PELVIS W CONTRAST  Melena - Plan: Ambulatory referral to Gastroenterology  Epigastric abdominal pain - Plan: Ambulatory referral to Gastroenterology  1/2. F/u w contrast enhanced CT as rec'd by radiology.  3/4. Refer GI for their opinion. Stop Carafate. Take omeprazole 40 mg/d. Send message in 2 weeks with update if not improving.  The patient voiced understanding and agreement to the plan.  Jilda Roche [place], DO 12/19/23  2:00 PM

## 2023-12-21 ENCOUNTER — Ambulatory Visit (INDEPENDENT_AMBULATORY_CARE_PROVIDER_SITE_OTHER)
Admission: RE | Admit: 2023-12-21 | Discharge: 2023-12-21 | Disposition: A | Discharge status: Home / Self Care | Source: Ambulatory Visit | Attending: Family Medicine | Admitting: Family Medicine

## 2023-12-21 DIAGNOSIS — N2889 Other specified disorders of kidney and ureter: Secondary | ICD-10-CM | POA: Diagnosis not present

## 2023-12-21 MED ORDER — IOHEXOL 300 MG/ML  SOLN
100.0000 mL | Freq: Once | INTRAMUSCULAR | Status: AC | PRN
  Administered 2023-12-21: 100 mL via INTRAVENOUS

## 2023-12-25 ENCOUNTER — Other Ambulatory Visit: Payer: Self-pay | Admitting: Family Medicine

## 2023-12-25 ENCOUNTER — Other Ambulatory Visit (HOSPITAL_BASED_OUTPATIENT_CLINIC_OR_DEPARTMENT_OTHER): Payer: Self-pay

## 2023-12-25 ENCOUNTER — Encounter: Payer: Self-pay | Admitting: Family Medicine

## 2023-12-25 MED ORDER — PANTOPRAZOLE SODIUM 40 MG PO TBEC
40.0000 mg | DELAYED_RELEASE_TABLET | Freq: Every day | ORAL | 1 refills | Status: DC
  Filled 2023-12-25: qty 30, 30d supply, fill #0
  Filled 2024-01-26: qty 30, 30d supply, fill #1

## 2023-12-26 NOTE — Telephone Encounter (Signed)
 Jimmy Barrett w/ radiology team stated that results should be in by tomorrow, 12/27/23 if not today,12/26/23.

## 2023-12-29 ENCOUNTER — Encounter: Payer: Self-pay | Admitting: Family Medicine

## 2024-01-01 ENCOUNTER — Other Ambulatory Visit: Payer: Self-pay | Admitting: Family Medicine

## 2024-01-15 ENCOUNTER — Other Ambulatory Visit: Payer: Self-pay | Admitting: Family Medicine

## 2024-01-15 ENCOUNTER — Encounter: Payer: Self-pay | Admitting: Family Medicine

## 2024-01-15 ENCOUNTER — Other Ambulatory Visit (INDEPENDENT_AMBULATORY_CARE_PROVIDER_SITE_OTHER)

## 2024-01-15 DIAGNOSIS — E782 Mixed hyperlipidemia: Secondary | ICD-10-CM | POA: Diagnosis not present

## 2024-01-15 LAB — LIPID PANEL
Cholesterol: 116 mg/dL (ref 0–200)
HDL: 31.6 mg/dL — ABNORMAL LOW (ref >39.00)
LDL Cholesterol: 62 mg/dL (ref 0–99)
NonHDL: 83.99
Total CHOL/HDL Ratio: 4
Triglycerides: 109 mg/dL (ref 0.0–149.0)
VLDL: 21.8 mg/dL (ref 0.0–40.0)

## 2024-01-29 ENCOUNTER — Other Ambulatory Visit (HOSPITAL_BASED_OUTPATIENT_CLINIC_OR_DEPARTMENT_OTHER): Payer: Self-pay

## 2024-02-27 ENCOUNTER — Other Ambulatory Visit: Payer: Self-pay | Admitting: Family Medicine

## 2024-02-27 ENCOUNTER — Other Ambulatory Visit (HOSPITAL_BASED_OUTPATIENT_CLINIC_OR_DEPARTMENT_OTHER): Payer: Self-pay

## 2024-02-28 ENCOUNTER — Other Ambulatory Visit (HOSPITAL_BASED_OUTPATIENT_CLINIC_OR_DEPARTMENT_OTHER): Payer: Self-pay

## 2024-02-28 MED ORDER — PANTOPRAZOLE SODIUM 40 MG PO TBEC
40.0000 mg | DELAYED_RELEASE_TABLET | Freq: Every day | ORAL | 1 refills | Status: DC
  Filled 2024-02-28: qty 30, 30d supply, fill #0
  Filled 2024-04-05: qty 30, 30d supply, fill #1

## 2024-03-24 ENCOUNTER — Ambulatory Visit (HOSPITAL_BASED_OUTPATIENT_CLINIC_OR_DEPARTMENT_OTHER)
Admission: EM | Admit: 2024-03-24 | Discharge: 2024-03-24 | Disposition: A | Discharge status: Home / Self Care | Attending: Family Medicine | Admitting: Family Medicine

## 2024-03-24 ENCOUNTER — Encounter (HOSPITAL_BASED_OUTPATIENT_CLINIC_OR_DEPARTMENT_OTHER): Payer: Self-pay | Admitting: Emergency Medicine

## 2024-03-24 ENCOUNTER — Encounter

## 2024-03-24 DIAGNOSIS — L03116 Cellulitis of left lower limb: Secondary | ICD-10-CM

## 2024-03-24 DIAGNOSIS — W57XXXA Bitten or stung by nonvenomous insect and other nonvenomous arthropods, initial encounter: Secondary | ICD-10-CM

## 2024-03-24 DIAGNOSIS — M79662 Pain in left lower leg: Secondary | ICD-10-CM | POA: Diagnosis not present

## 2024-03-24 DIAGNOSIS — M79605 Pain in left leg: Secondary | ICD-10-CM

## 2024-03-24 DIAGNOSIS — S80862A Insect bite (nonvenomous), left lower leg, initial encounter: Secondary | ICD-10-CM | POA: Diagnosis not present

## 2024-03-24 DIAGNOSIS — R269 Unspecified abnormalities of gait and mobility: Secondary | ICD-10-CM

## 2024-03-24 DIAGNOSIS — R2689 Other abnormalities of gait and mobility: Secondary | ICD-10-CM | POA: Insufficient documentation

## 2024-03-24 DIAGNOSIS — T63481A Toxic effect of venom of other arthropod, accidental (unintentional), initial encounter: Secondary | ICD-10-CM | POA: Insufficient documentation

## 2024-03-24 MED ORDER — TRIAMCINOLONE ACETONIDE 40 MG/ML IJ SUSP
60.0000 mg | Freq: Once | INTRAMUSCULAR | Status: AC
  Administered 2024-03-24: 60 mg via INTRAMUSCULAR

## 2024-03-24 MED ORDER — MUPIROCIN 2 % EX OINT: 1.0000 | TOPICAL_OINTMENT | Freq: Two times a day (BID) | CUTANEOUS | 0 refills | Status: DC

## 2024-03-24 MED ORDER — CETIRIZINE HCL 10 MG PO TABS: 10.0000 mg | ORAL_TABLET | Freq: Every day | ORAL | 0 refills | Status: DC | PRN

## 2024-03-24 MED ORDER — EPINEPHRINE 0.3 MG/0.3ML IJ SOAJ: 0.3000 mg | INTRAMUSCULAR | 2 refills | Status: AC | PRN

## 2024-03-24 MED ORDER — SULFAMETHOXAZOLE-TRIMETHOPRIM 800-160 MG PO TABS: 1.0000 | ORAL_TABLET | Freq: Two times a day (BID) | ORAL | 0 refills | Status: AC

## 2024-03-24 NOTE — ED Triage Notes (Signed)
 Pt reports last night after a walk around his house he got bite by something on his inner left leg, he woke up with pain took a benadryl and ibuprofen  but no relieve in the pain, pt is unable to apply pressure to his leg.

## 2024-03-24 NOTE — ED Provider Notes (Addendum)
 [name]    CSN: [id number] Arrival date & time: 03/24/24  9074      History   Chief Complaint Chief Complaint  Patient presents with   Insect Bite    HPI Jimmy Barrett is a 38 y.o. male.   Patient was out walking around his house with his wife last night (03/23/2024).  Something bit him on the left lower leg.  It hurt at the time of the bite but did not seem bad.  He woke up during the night with significant leg pain that was hurting with any movement in the bed.  He took Benadryl and ibuprofen  but he was not able to go back to sleep due to pain.  He is having trouble walking on that left leg.  It is swollen very tender and warm to touch.  He is not able to walk on his left leg right now.   Acute allergic  Past Medical History:  Diagnosis Date   NAFLD (nonalcoholic fatty liver disease)     Patient Active Problem List   Diagnosis Date Noted   NAFLD (nonalcoholic fatty liver disease)    Chronic pain of both knees 02/24/2020   Low back pain 02/24/2020    Past Surgical History:  Procedure Laterality Date   OTHER SURGICAL HISTORY     Shrapnel removed from leg in [place]       Home Medications    Prior to Admission medications   Medication Sig Start Date End Date Taking? Authorizing Provider  cetirizine  (ZYRTEC ) 10 MG tablet Take 1 tablet (10 mg total) by mouth daily as needed for allergies (allergic reaction). 03/24/24  Yes Ival Domino, FNP  EPINEPHrine  (EPIPEN  2-PAK) 0.3 mg/0.3 mL IJ SOAJ injection Inject 0.3 mg into the muscle as needed for anaphylaxis. 03/24/24  Yes Ival Domino, FNP  mupirocin  ointment (BACTROBAN ) 2 % Apply 1 Application topically 2 (two) times daily. 03/24/24  Yes Ival Domino, FNP  pantoprazole  (PROTONIX ) 40 MG tablet Take 1 tablet (40 mg total) by mouth daily. 02/28/24  Yes Frann Mabel Mt, DO  rosuvastatin  (CRESTOR ) 20 MG tablet Take 1 tablet (20 mg total) by mouth daily. 12/14/23  Yes Frann Mabel Mt, DO   sulfamethoxazole -trimethoprim  (BACTRIM  DS) 800-160 MG tablet Take 1 tablet by mouth 2 (two) times daily for 7 days. 03/24/24 03/31/24 Yes Ival Domino, FNP  albuterol  (VENTOLIN  HFA) 108 (90 Base) MCG/ACT inhaler Inhale 2 puffs into the lungs every 6 (six) hours as needed for wheezing or shortness of breath. 10/15/23   Fleming, Zelda W, NP  fenofibrate  (TRICOR ) 145 MG tablet Take 1 tablet (145 mg total) by mouth daily. 12/11/23   Frann Mabel Mt, DO  L-Tyrosine 500 MG CAPS Take 1 capsule by mouth daily.    [provider]  Multiple Vitamin (MULTIVITAMIN PO) Take by mouth.    [provider]  Omega-3 Fatty Acids (FISH OIL PO) Take by mouth.    [provider]  triamcinolone  cream (KENALOG ) 0.1 % Apply to affected area 2 (two) times daily as directed. 06/12/23   Frann Mabel Mt, DO    Family History Family History  Problem Relation Age of Onset   Cancer Father        passed away from colon Cancer   Sleep apnea Neg Hx     Social History Social History   Tobacco Use   Smoking status: Never   Smokeless tobacco: Never  Vaping Use   Vaping status: Never Used  Substance Use Topics  Alcohol use: Yes    Comment: socially   Drug use: No     Allergies   Patient has no known allergies.   Review of Systems Review of Systems  Constitutional:  Negative for fever.  Respiratory:  Negative for cough.   Cardiovascular:  Negative for chest pain.  Gastrointestinal:  Negative for abdominal pain, constipation, diarrhea, nausea and vomiting.  Musculoskeletal:  Positive for arthralgias (Left lower leg pain and cannot walk on left leg.). Negative for back pain.  Skin:  Positive for wound (Left lower leg with insect bite, swelling, erythema and warmth.  There is a small pustule). Negative for color change and rash.  Neurological:  Negative for syncope.  All other systems reviewed and are negative.    Physical Exam Triage Vital Signs ED Triage Vitals   Encounter Vitals Group     BP 03/24/24 1028 119/75     Girls Systolic BP Percentile --      Girls Diastolic BP Percentile --      Boys Systolic BP Percentile --      Boys Diastolic BP Percentile --      Pulse Rate 03/24/24 1028 87     Resp 03/24/24 1028 18     Temp 03/24/24 1028 98.4 F (36.9 C)     Temp Source 03/24/24 1028 Oral     SpO2 03/24/24 1028 96 %     Weight --      Height --      Head Circumference --      Peak Flow --      Pain Score 03/24/24 1027 10     Pain Loc --      Pain Education --      Exclude from Growth Chart --    No data found.  Updated Vital Signs BP 119/75 (BP Location: Right Arm)   Pulse 87   Temp 98.4 F (36.9 C) (Oral)   Resp 18   SpO2 96%   Visual Acuity Right Eye Distance:   Left Eye Distance:   Bilateral Distance:    Right Eye Near:   Left Eye Near:    Bilateral Near:     Physical Exam Vitals and nursing note reviewed.  Constitutional:      General: He is not in acute distress.    Appearance: He is well-developed. He is not ill-appearing or toxic-appearing.  HENT:     Head: Normocephalic and atraumatic.     Right Ear: External ear normal.     Left Ear: External ear normal.     Nose: Nose normal.     Mouth/Throat:     Lips: Pink.     Mouth: Mucous membranes are moist.  Eyes:     Conjunctiva/sclera: Conjunctivae normal.     Pupils: Pupils are equal, round, and reactive to light.  Cardiovascular:     Rate and Rhythm: Normal rate and regular rhythm.     Heart sounds: S1 normal and S2 normal. No murmur heard. Pulmonary:     Effort: Pulmonary effort is normal. No respiratory distress.     Breath sounds: Normal breath sounds. No decreased breath sounds, wheezing, rhonchi or rales.  Musculoskeletal:        General: No swelling.     Right hip: Normal.     Left hip: Normal.     Right upper leg: Normal.     Left upper leg: Normal.     Right knee: Normal.     Left knee: Normal.  Right lower leg: Normal.     Left lower  leg: Swelling and tenderness present. 2+ Edema present.     Right ankle: Normal.     Left ankle: Swelling present. Tenderness present. Decreased range of motion (Due to pain).     Comments: Left lower leg swelling, erythema and warmth with a small pustule (see photos for more information)  Skin:    General: Skin is warm and dry.     Capillary Refill: Capillary refill takes less than 2 seconds.     Findings: Wound (Left lower leg with insect bite, swelling, erythema and warmth. There is a small pustule) present. No rash.  Neurological:     Mental Status: He is alert and oriented to person, place, and time.     Gait: Gait abnormal (Cannot put weight on left lower leg due to pain.  Used a wheelchair to get into the office today.).  Psychiatric:        Mood and Affect: Mood normal.         UC Treatments / Results  Labs (all labs ordered are listed, but only abnormal results are displayed) Labs Reviewed  AEROBIC CULTURE W GRAM STAIN (SUPERFICIAL SPECIMEN)    EKG   Radiology No results found.  Procedures Procedures (including critical care time)  Medications Ordered in UC Medications  triamcinolone  acetonide (KENALOG -40) injection 60 mg (60 mg Intramuscular Given 03/24/24 1218)    Initial Impression / Assessment and Plan / UC Course  I have reviewed the triage vital signs and the nursing notes.  Pertinent labs & imaging results that were available during my care of the patient were reviewed by me and considered in my medical decision making (see chart for details).  Plan of Care: Insect bite with allergic reaction and possible early cellulitis of left lower leg: Kenalog  60 mg now.  See discharge instructions for more patient instructions.  Clean the site with warm soapy fingers, rinse, pat dry, apply mupirocin  ointment and bandage.  Wound culture collected.  Will adjust the plan of care, if needed once the culture results.  Bactrim  DS 1 pill twice daily for 7 days.  Get plenty  of fluids and rest.  For the allergic reaction use cetirizine , 10 mg daily.  Provided an EpiPen  for use in emergencies, if needed with education handout.  Left lower leg pain and altered gait: Patient cannot put weight on his left lower leg for now.  There was no injury or trauma so x-rays not done.  Provided crutches.  Follow-up if symptoms do not improve, worsen or new symptoms occur.  I reviewed the plan of care with the patient and/or the patient's guardian.  The patient and/or guardian had time to ask questions and acknowledged that the questions were answered.  I provided instruction on symptoms or reasons to return here or to go to an ER, if symptoms/condition did not improve, worsened or if new symptoms occurred.  Final Clinical Impressions(s) / UC Diagnoses   Final diagnoses:  Insect bite of left lower leg with local reaction, initial encounter  Cellulitis of left lower extremity  Abnormality of gait  Left leg pain     Discharge Instructions      Reaction to an insect bite on the left lower leg with secondary cellulitis: Kenalog  60 mg injection (this is a steroid) in the office today.  Do not use Advil , Motrin , Ibuprofen , Aleve, Naproxen Sodium (NSAIDS) for 1 to 2 weeks during/after using oral steroids or after a steroid  injection.  May use Tylenol /Acetaminophen , 500 mg,1-1.5 pills, every 6 hours as needed for pain.   Significant and acute allergic reaction: Provided an EpiPen  with instructions on how to use it in case he has this, reaction in the future.  Possible early cellulitis: Bactrim  DS (Sulfa -Trimethoprim ), 1 pill twice daily for 7 days.  Clean the site with warm soapy fingers, rinse, pat dry, apply mupirocin  ointment to the site.  Follow-up here or with primary care if symptoms do not improve, worsen or new symptoms occur.     ED Prescriptions     Medication Sig Dispense Auth. Provider   sulfamethoxazole -trimethoprim  (BACTRIM  DS) 800-160 MG tablet Take 1 tablet  by mouth 2 (two) times daily for 7 days. 14 tablet Gildo Crisco, FNP   cetirizine  (ZYRTEC ) 10 MG tablet Take 1 tablet (10 mg total) by mouth daily as needed for allergies (allergic reaction). 30 tablet Alecxander Mainwaring, FNP   mupirocin  ointment (BACTROBAN ) 2 % Apply 1 Application topically 2 (two) times daily. 22 g Ival Domino, FNP   EPINEPHrine  (EPIPEN  2-PAK) 0.3 mg/0.3 mL IJ SOAJ injection Inject 0.3 mg into the muscle as needed for anaphylaxis. 1 each Ival Domino, FNP      PDMP not reviewed this encounter.   Ival Domino, FNP 03/24/24 1225    Ival Domino, FNP 03/24/24 1249

## 2024-03-24 NOTE — Discharge Instructions (Addendum)
 Reaction to an insect bite on the left lower leg with secondary cellulitis: Kenalog  60 mg injection (this is a steroid) in the office today.  Do not use Advil , Motrin , Ibuprofen , Aleve, Naproxen Sodium (NSAIDS) for 1 to 2 weeks during/after using oral steroids or after a steroid injection.  May use Tylenol /Acetaminophen , 500 mg,1-1.5 pills, every 6 hours as needed for pain.   Significant and acute allergic reaction: Provided an EpiPen  with instructions on how to use it in case he has this, reaction in the future.  Possible early cellulitis: Bactrim  DS (Sulfa -Trimethoprim ), 1 pill twice daily for 7 days.  Clean the site with warm soapy fingers, rinse, pat dry, apply mupirocin  ointment to the site.  Follow-up here or with primary care if symptoms do not improve, worsen or new symptoms occur.

## 2024-03-26 ENCOUNTER — Ambulatory Visit (HOSPITAL_COMMUNITY): Payer: Self-pay

## 2024-03-26 LAB — AEROBIC CULTURE W GRAM STAIN (SUPERFICIAL SPECIMEN)
Culture: NORMAL
Gram Stain: NONE SEEN

## 2024-03-28 NOTE — Progress Notes (Signed)
 Wound culture is negative but initially I had trouble getting really any sample to swab for the culture.  Patient advised of the negative results via voicemail.  Encouraged to complete the antibiotic if his symptoms are improving.  Encouraged to call or return if his symptoms or not improving.

## 2024-04-05 ENCOUNTER — Other Ambulatory Visit (HOSPITAL_BASED_OUTPATIENT_CLINIC_OR_DEPARTMENT_OTHER): Payer: Self-pay

## 2024-04-11 ENCOUNTER — Other Ambulatory Visit (HOSPITAL_BASED_OUTPATIENT_CLINIC_OR_DEPARTMENT_OTHER): Payer: Self-pay

## 2024-05-09 ENCOUNTER — Other Ambulatory Visit: Payer: Self-pay | Admitting: Family Medicine

## 2024-05-09 ENCOUNTER — Telehealth: Admitting: Physician Assistant

## 2024-05-09 ENCOUNTER — Other Ambulatory Visit (HOSPITAL_BASED_OUTPATIENT_CLINIC_OR_DEPARTMENT_OTHER): Payer: Self-pay

## 2024-05-09 DIAGNOSIS — L539 Erythematous condition, unspecified: Secondary | ICD-10-CM | POA: Diagnosis not present

## 2024-05-09 DIAGNOSIS — T63461A Toxic effect of venom of wasps, accidental (unintentional), initial encounter: Secondary | ICD-10-CM

## 2024-05-09 DIAGNOSIS — R609 Edema, unspecified: Secondary | ICD-10-CM | POA: Diagnosis not present

## 2024-05-09 MED ORDER — PREDNISONE 20 MG PO TABS
40.0000 mg | ORAL_TABLET | Freq: Every day | ORAL | 0 refills | Status: DC
  Filled 2024-05-09: qty 10, 5d supply, fill #0

## 2024-05-09 MED ORDER — PANTOPRAZOLE SODIUM 40 MG PO TBEC
40.0000 mg | DELAYED_RELEASE_TABLET | Freq: Every day | ORAL | 0 refills | Status: DC
  Filled 2024-05-09: qty 30, 30d supply, fill #0
  Filled 2024-06-10: qty 30, 30d supply, fill #1
  Filled 2024-07-24: qty 30, 30d supply, fill #2

## 2024-05-09 NOTE — Progress Notes (Signed)
 E-Visit for Insect Sting  Thank you for describing the insect sting for us .  Here is how we plan to help!  Based on the information you have shared with me it looks like you have: A sting that we will treat with a short dose of Prednisone .  The 2 greatest risks from insect stings are allergic reaction, which can be fatal in some people and infection, which is more common and less serious.  Bees, wasps, yellow jackets, and hornets belong to a class of insects called Hymenoptera.  Most insect stings cause only minor discomfort.  Stings can happen anywhere on the body and can be painful.  Most stings are from honey bees or yellow jackets.  Fire ants can sting multiple times.  The sites of the stings are more likely to become infected.    Based on your information I have:, Provided a home care guide for insect stings and instructions on when to call for help., and I have sent in prednisone  40 mg by mouth daily for 5 days to the pharmacy you selected.  Please make sure that you selected a pharmacy that is open now.  What can be used to prevent Insect Stings?  Insect repellant with at least 20% DEET.  Wearing long pants and shirts with socks and shoes.  Wear dark or drab-colored clothes rather than bright colors.  Avoid using perfumes and hair sprays; these attract insects.  HOME CARE ADVICE:  1. Stinger removal: The stinger looks like a tiny black dot in the sting. Use a fingernail, credit card edge, or knife-edge to scrape it off.  Don't pull it out because it squeezes out more venom. If the stinger is below the skin surface, leave it alone.  It will be shed with normal skin healing. 2. Use cold compresses to the area of the sting for 10-20 minutes.  You may repeat this as needed to relieve symptoms of pain and swelling. 3.  For pain relief, take acetominophen 650 mg 4-6 hours as needed or ibuprofen  400 mg every 6-8 hours as needed or naproxen 250-500 mg every 12 hours as needed. 4.  You  can also use hydrocortisone cream 0.5% or 1% up to 4 times daily as needed for itching. 5.  If the sting becomes very itchy, take Benadryl 25-50 mg, follow directions on box. 6.  Wash the area 2-3 times daily with antibacterial soap and warm water. 7. Call your Doctor if: Fever, a severe headache, or rash occur in the next 2 weeks. Sting area begins to look infected. Redness and swelling worsens after home treatment. Your current symptoms become worse.    MAKE SURE YOU:  Understand these instructions. Will watch your condition. Will get help right away if you are not doing well or get worse.  Thank you for choosing an e-visit.  Your e-visit answers were reviewed by a board certified advanced clinical practitioner to complete your personal care plan. Depending upon the condition, your plan could have included both over the counter or prescription medications.  Please review your pharmacy choice. Make sure the pharmacy is open so you can pick up prescription now. If there is a problem, you may contact your provider through [employer] and have the prescription routed to another pharmacy.  Your safety is important to us . If you have drug allergies check your prescription carefully.   For the next 24 hours you can use MyChart to ask questions about today's visit, request a non-urgent call back, or ask  for a work or school excuse. You will get an email in the next two days asking about your experience. I hope that your e-visit has been valuable and will speed your recovery.    I have spent 5 minutes in review of e-visit questionnaire, review and updating patient chart, medical decision making and response to patient.   Delon CHRISTELLA Dickinson, PA-C

## 2024-06-11 ENCOUNTER — Other Ambulatory Visit (HOSPITAL_BASED_OUTPATIENT_CLINIC_OR_DEPARTMENT_OTHER): Payer: Self-pay

## 2024-06-17 ENCOUNTER — Encounter: Payer: Self-pay | Admitting: Family Medicine

## 2024-06-17 ENCOUNTER — Ambulatory Visit: Payer: Self-pay | Admitting: Family Medicine

## 2024-06-17 ENCOUNTER — Other Ambulatory Visit: Payer: Self-pay

## 2024-06-17 ENCOUNTER — Ambulatory Visit: Admitting: Family Medicine

## 2024-06-17 ENCOUNTER — Other Ambulatory Visit (HOSPITAL_BASED_OUTPATIENT_CLINIC_OR_DEPARTMENT_OTHER): Payer: Self-pay

## 2024-06-17 VITALS — BP 128/80 | HR 86 | Temp 97.5°F | Resp 16 | Ht 68.0 in | Wt 215.6 lb

## 2024-06-17 DIAGNOSIS — Z23 Encounter for immunization: Secondary | ICD-10-CM

## 2024-06-17 DIAGNOSIS — K219 Gastro-esophageal reflux disease without esophagitis: Secondary | ICD-10-CM | POA: Insufficient documentation

## 2024-06-17 DIAGNOSIS — E782 Mixed hyperlipidemia: Secondary | ICD-10-CM

## 2024-06-17 LAB — HEPATIC FUNCTION PANEL
ALT: 53 U/L (ref 0–53)
AST: 28 U/L (ref 0–37)
Albumin: 4.8 g/dL (ref 3.5–5.2)
Alkaline Phosphatase: 50 U/L (ref 39–117)
Bilirubin, Direct: 0.2 mg/dL (ref 0.0–0.3)
Total Bilirubin: 0.9 mg/dL (ref 0.2–1.2)
Total Protein: 7.2 g/dL (ref 6.0–8.3)

## 2024-06-17 LAB — LIPID PANEL
Cholesterol: 116 mg/dL (ref 0–200)
HDL: 36.8 mg/dL — ABNORMAL LOW (ref >39.00)
LDL Cholesterol: 54 mg/dL (ref 0–99)
NonHDL: 78.85
Total CHOL/HDL Ratio: 3
Triglycerides: 124 mg/dL (ref 0.0–149.0)
VLDL: 24.8 mg/dL (ref 0.0–40.0)

## 2024-06-17 MED ORDER — FENOFIBRATE 145 MG PO TABS: 77.5000 mg | ORAL_TABLET | Freq: Every day | ORAL | Status: DC

## 2024-06-17 NOTE — Progress Notes (Signed)
 Chief Complaint  Patient presents with   Follow-up    Follow Up    Subjective: Hyperlipidemia Patient presents for Hyperlipidemia follow up. Currently taking Crestor  20 mg/d, Tricor  145 mg/d and compliance with treatment thus far has been good. He denies myalgias. He is usually adhering to a healthy diet. Exercise: walking/lifting wts No CP or SOB. The patient is not known to have coexisting coronary artery disease.  GERD Taking Protonix  40 mg/d. Compliant, no AE's. Controlling ss's. No dysphagia, unintentional wt loss, bleeding.   Past Medical History:  Diagnosis Date   NAFLD (nonalcoholic fatty liver disease)     Objective: BP 128/80 (BP Location: Left Arm, Patient Position: Sitting)   Pulse 86   Temp (!) 97.5 F (36.4 C) (Temporal)   Resp 16   Ht 5' 8 (1.727 m)   Wt 215 lb 9.6 oz (97.8 kg)   SpO2 98%   BMI 32.78 kg/m  General: Awake, appears stated age Mouth: MMM Heart: RRR, no LE edema, no bruits Lungs: CTAB, no rales, wheezes or rhonchi. No accessory muscle use Abd: BS+, S, NT, ND Psych: Age appropriate judgment and insight, normal affect and mood  Assessment and Plan: Mixed hyperlipidemia - Plan: Lipid panel, Hepatic function panel  Gastroesophageal reflux disease, unspecified whether esophagitis present  Chronic, stable.  Continue Tricor  145 mg daily, Crestor  20 mg daily.  Counseled on diet and exercise.  Diet has been improving.  May start weaning down on Tricor .  Check above labs. Chronic, stable.  Continue Protonix  40 mg daily as needed. Flu shot today. F/u in 6 mo. The patient voiced understanding and agreement to the plan.  Mabel Mt [place], DO 06/17/24  9:40 AM

## 2024-06-17 NOTE — Patient Instructions (Signed)
Keep the diet clean and stay active.  Give us 2-3 business days to get the results of your labs back.   Let us know if you need anything.  

## 2024-06-18 ENCOUNTER — Other Ambulatory Visit (HOSPITAL_BASED_OUTPATIENT_CLINIC_OR_DEPARTMENT_OTHER): Payer: Self-pay

## 2024-06-18 ENCOUNTER — Other Ambulatory Visit: Payer: Self-pay | Admitting: Family Medicine

## 2024-06-18 MED ORDER — FENOFIBRATE 145 MG PO TABS
72.5000 mg | ORAL_TABLET | Freq: Every day | ORAL | 1 refills | Status: AC
  Filled 2024-06-18: qty 15, 30d supply, fill #0
  Filled 2024-07-24: qty 15, 30d supply, fill #1
  Filled 2024-09-30: qty 15, 30d supply, fill #2

## 2024-07-24 ENCOUNTER — Other Ambulatory Visit: Payer: Self-pay | Admitting: Family Medicine

## 2024-07-25 ENCOUNTER — Other Ambulatory Visit (HOSPITAL_BASED_OUTPATIENT_CLINIC_OR_DEPARTMENT_OTHER): Payer: Self-pay

## 2024-07-26 ENCOUNTER — Other Ambulatory Visit (HOSPITAL_BASED_OUTPATIENT_CLINIC_OR_DEPARTMENT_OTHER): Payer: Self-pay

## 2024-07-26 ENCOUNTER — Other Ambulatory Visit: Payer: Self-pay | Admitting: Family Medicine

## 2024-07-26 MED ORDER — ROSUVASTATIN CALCIUM 20 MG PO TABS
20.0000 mg | ORAL_TABLET | Freq: Every day | ORAL | 1 refills | Status: AC
  Filled 2024-07-26: qty 30, 30d supply, fill #0
  Filled 2024-09-30: qty 30, 30d supply, fill #1

## 2024-07-29 ENCOUNTER — Other Ambulatory Visit

## 2024-09-30 ENCOUNTER — Ambulatory Visit (HOSPITAL_BASED_OUTPATIENT_CLINIC_OR_DEPARTMENT_OTHER): Admit: 2024-09-30 | Discharge: 2024-09-30 | Disposition: A | Admitting: Radiology

## 2024-09-30 ENCOUNTER — Encounter (HOSPITAL_BASED_OUTPATIENT_CLINIC_OR_DEPARTMENT_OTHER): Payer: Self-pay | Admitting: Emergency Medicine

## 2024-09-30 ENCOUNTER — Ambulatory Visit (HOSPITAL_BASED_OUTPATIENT_CLINIC_OR_DEPARTMENT_OTHER)
Admission: EM | Admit: 2024-09-30 | Discharge: 2024-09-30 | Disposition: A | Discharge status: Home / Self Care | Attending: Family Medicine | Admitting: Family Medicine

## 2024-09-30 ENCOUNTER — Other Ambulatory Visit (HOSPITAL_BASED_OUTPATIENT_CLINIC_OR_DEPARTMENT_OTHER): Payer: Self-pay

## 2024-09-30 DIAGNOSIS — R112 Nausea with vomiting, unspecified: Secondary | ICD-10-CM

## 2024-09-30 DIAGNOSIS — R1012 Left upper quadrant pain: Secondary | ICD-10-CM | POA: Diagnosis not present

## 2024-09-30 DIAGNOSIS — R197 Diarrhea, unspecified: Secondary | ICD-10-CM

## 2024-09-30 DIAGNOSIS — R1032 Left lower quadrant pain: Secondary | ICD-10-CM

## 2024-09-30 MED ORDER — ONDANSETRON 4 MG PO TBDP
4.0000 mg | ORAL_TABLET | Freq: Three times a day (TID) | ORAL | 0 refills | Status: AC | PRN
  Filled 2024-09-30: qty 20, 15d supply, fill #0

## 2024-09-30 MED ORDER — AMOXICILLIN-POT CLAVULANATE 875-125 MG PO TABS
1.0000 | ORAL_TABLET | Freq: Two times a day (BID) | ORAL | 0 refills | Status: AC
  Filled 2024-09-30: qty 20, 10d supply, fill #0

## 2024-09-30 NOTE — ED Provider Notes (Signed)
 " [name]    CSN: [id number] Arrival date & time: 09/30/24  1237      History   Chief Complaint No chief complaint on file.   HPI DAT Jimmy Barrett is a 39 y.o. male.   39 year old male who reports left lower abdominal pain that was sharp and started on Thursday, 09/26/2024.  He has had nausea, vomiting and diarrhea.  He had the nausea since Thursday but he did not have vomiting until during the night of 09/29/2024 and into this morning.  The vomiting actually came due to severe left lower quadrant and left upper quadrant abdominal pain.  He has never had a colonoscopy.  His father was diagnosed at 31 with colon cancer and did not survive.  He thinks there is a knot in his left lower abdomen in the area where he is hurting.  He denies fever, vomiting, constipation, cough, body aches.     Past Medical History:  Diagnosis Date   NAFLD (nonalcoholic fatty liver disease)     Patient Active Problem List   Diagnosis Date Noted   Mixed hyperlipidemia 06/17/2024   Gastroesophageal reflux disease 06/17/2024   NAFLD (nonalcoholic fatty liver disease)    Chronic pain of both knees 02/24/2020   Low back pain 02/24/2020    Past Surgical History:  Procedure Laterality Date   OTHER SURGICAL HISTORY     Shrapnel removed from leg in [place]       Home Medications    Prior to Admission medications  Medication Sig Start Date End Date Taking? Authorizing Provider  amoxicillin -clavulanate (AUGMENTIN ) 875-125 MG tablet Take 1 tablet by mouth 2 (two) times daily after a meal for 10 days. 09/30/24 10/10/24 Yes Ival Domino, FNP  fenofibrate  (TRICOR ) 145 MG tablet Take 0.5 tablets (72.5 mg total) by mouth daily. 06/18/24  Yes Frann Mabel Mt, DO  ondansetron  (ZOFRAN -ODT) 4 MG disintegrating tablet Take 1 tablet (4 mg total) by mouth every 8 (eight) hours as needed for nausea or vomiting. 09/30/24  Yes Ival Domino, FNP  rosuvastatin  (CRESTOR ) 20 MG tablet Take 1 tablet  (20 mg total) by mouth daily. 07/26/24  Yes Frann Mabel Mt, DO  albuterol  (VENTOLIN  HFA) 108 (90 Base) MCG/ACT inhaler Inhale 2 puffs into the lungs every 6 (six) hours as needed for wheezing or shortness of breath. 10/15/23   Fleming, Zelda W, NP  EPINEPHrine  (EPIPEN  2-PAK) 0.3 mg/0.3 mL IJ SOAJ injection Inject 0.3 mg into the muscle as needed for anaphylaxis. 03/24/24   Ival Domino, FNP  L-Tyrosine 500 MG CAPS Take 1 capsule by mouth daily.    [provider]  Multiple Vitamin (MULTIVITAMIN PO) Take by mouth.    [provider]  Omega-3 Fatty Acids (FISH OIL PO) Take by mouth.    [provider]    Family History Family History  Problem Relation Age of Onset   Cancer Father        passed away from colon Cancer   Sleep apnea Neg Hx     Social History Social History[1]   Allergies   Patient has no known allergies.   Review of Systems Review of Systems  Constitutional:  Negative for chills and fever.  HENT:  Negative for ear pain and sore throat.   Eyes:  Negative for pain and visual disturbance.  Respiratory:  Negative for cough.   Cardiovascular:  Negative for chest pain and palpitations.  Gastrointestinal:  Positive for abdominal pain and diarrhea. Negative for constipation, nausea and vomiting.  Genitourinary:  Negative for dysuria and hematuria.  Musculoskeletal:  Negative for arthralgias and back pain.  Skin:  Negative for color change and rash.  Neurological:  Negative for seizures and syncope.  All other systems reviewed and are negative.    Physical Exam Triage Vital Signs ED Triage Vitals  Encounter Vitals Group     BP 09/30/24 1303 115/78     Girls Systolic BP Percentile --      Girls Diastolic BP Percentile --      Boys Systolic BP Percentile --      Boys Diastolic BP Percentile --      Pulse Rate 09/30/24 1303 (!) 123     Resp 09/30/24 1303 18     Temp 09/30/24 1303 98.2 F (36.8 C)     Temp Source 09/30/24 1303 Oral      SpO2 09/30/24 1303 96 %     Weight --      Height --      Head Circumference --      Peak Flow --      Pain Score 09/30/24 1302 3     Pain Loc --      Pain Education --      Exclude from Growth Chart --    No data found.  Updated Vital Signs BP 115/78 (BP Location: Right Arm)   Pulse (!) 123   Temp 98.2 F (36.8 C) (Oral)   Resp 18   SpO2 96%   Visual Acuity Right Eye Distance:   Left Eye Distance:   Bilateral Distance:    Right Eye Near:   Left Eye Near:    Bilateral Near:     Physical Exam Vitals and nursing note reviewed.  Constitutional:      General: He is not in acute distress.    Appearance: He is well-developed. He is not ill-appearing or toxic-appearing.  HENT:     Head: Normocephalic and atraumatic.     Right Ear: Hearing, tympanic membrane, ear canal and external ear normal.     Left Ear: Hearing, tympanic membrane, ear canal and external ear normal.     Nose: No congestion or rhinorrhea.     Right Sinus: No maxillary sinus tenderness or frontal sinus tenderness.     Left Sinus: No maxillary sinus tenderness or frontal sinus tenderness.     Mouth/Throat:     Lips: Pink.     Mouth: Mucous membranes are moist.     Pharynx: Uvula midline. No oropharyngeal exudate or posterior oropharyngeal erythema.     Tonsils: No tonsillar exudate.  Eyes:     Conjunctiva/sclera: Conjunctivae normal.     Pupils: Pupils are equal, round, and reactive to light.  Cardiovascular:     Rate and Rhythm: Normal rate and regular rhythm.     Heart sounds: S1 normal and S2 normal. No murmur heard. Pulmonary:     Effort: Pulmonary effort is normal. No respiratory distress.     Breath sounds: Normal breath sounds. No decreased breath sounds, wheezing, rhonchi or rales.  Abdominal:     General: Bowel sounds are normal.     Palpations: Abdomen is soft.     Tenderness: There is no abdominal tenderness.  Musculoskeletal:        General: No swelling.     Cervical back: Neck  supple.  Lymphadenopathy:     Head:     Right side of head: No submental, submandibular, tonsillar, preauricular or posterior auricular adenopathy.  Left side of head: No submental, submandibular, tonsillar, preauricular or posterior auricular adenopathy.     Cervical: No cervical adenopathy.     Right cervical: No superficial cervical adenopathy.    Left cervical: No superficial cervical adenopathy.  Skin:    General: Skin is warm and dry.     Capillary Refill: Capillary refill takes less than 2 seconds.     Findings: No rash.  Neurological:     Mental Status: He is alert and oriented to person, place, and time.  Psychiatric:        Mood and Affect: Mood normal.      UC Treatments / Results  Labs (all labs ordered are listed, but only abnormal results are displayed) Labs Reviewed - No data to display  EKG   Radiology No results found.  Procedures Procedures (including critical care time)  Medications Ordered in UC Medications - No data to display  Initial Impression / Assessment and Plan / UC Course  I have reviewed the triage vital signs and the nursing notes.  Pertinent labs & imaging results that were available during my care of the patient were reviewed by me and considered in my medical decision making (see chart for details).  Plan of Care (see discharge instructions for additional patient precautions and education): Left lower and upper quadrant abdominal pain, nausea and vomiting, diarrhea, possible diverticulitis: Abdominal x-ray shows a moderate stool burden and some gas.  No signs of bowel obstruction.  Use ondansetron , 4 mg, every 8 hours if needed for nausea and vomiting.  Eat a bland diet.  Augmentin , 875 mg-125 mg, 1 pill twice daily for 10 days for possible diverticulitis.  Take the Augmentin  with food.  Consider probiotics to prevent antibiotic related diarrhea.  Get plenty of fluids and rest.  See discharge instruction for signs and symptoms of  worsening condition and reasons to go to an emergency room.  Follow-up if symptoms do not improve, worsen or new symptoms occur.  But patient may need to go to an emergency room where he could get a CT scan.  I reviewed the plan of care with the patient and/or the patient's guardian.  The patient and/or guardian had time to ask questions and acknowledged that the questions were answered.  Final Clinical Impressions(s) / UC Diagnoses   Final diagnoses:  Left lower quadrant abdominal pain  Left upper quadrant abdominal pain  Nausea and vomiting, unspecified vomiting type  Diarrhea, unspecified type     Discharge Instructions      Left lower and upper quadrant abdominal pain, nausea and vomiting, diarrhea, possible diverticulitis: Abdominal x-ray shows a moderate stool burden and some gas.  No signs of bowel obstruction.  Use ondansetron , 4 mg, every 8 hours if needed for nausea and vomiting.  Eat a bland diet.  Augmentin , 875 mg-125 mg, 1 pill twice daily for 10 days for possible diverticulitis.  Take the Augmentin  with food.  Consider probiotics to prevent antibiotic related diarrhea.  Get plenty of fluids and rest.  See below for signs and symptoms of worsening condition and reasons to go to an emergency room.  Follow-up if symptoms do not improve, worsen or new symptoms occur.  But patient may need to go to an emergency room where he could get a CT scan.  Contact a health care provider if: Your pain gets worse. Your pooping does not go back to normal. Your symptoms do not get better with treatment. Your symptoms get worse all of a sudden.  You have a fever. You vomit more than one time. Your poop is bloody, black, or tarry. This information is not intended to replace advice given to you by your health care provider. Make sure you discuss any questions you have with your health care provider.     ED Prescriptions     Medication Sig Dispense Auth. Provider    amoxicillin -clavulanate (AUGMENTIN ) 875-125 MG tablet Take 1 tablet by mouth 2 (two) times daily after a meal for 10 days. 20 tablet Lovella Hardie, FNP   ondansetron  (ZOFRAN -ODT) 4 MG disintegrating tablet Take 1 tablet (4 mg total) by mouth every 8 (eight) hours as needed for nausea or vomiting. 20 tablet Mounir Skipper, FNP      PDMP not reviewed this encounter.    [1]  Social History Tobacco Use   Smoking status: Never   Smokeless tobacco: Never  Vaping Use   Vaping status: Never Used  Substance Use Topics   Alcohol use: Yes    Comment: socially   Drug use: No     Ival Domino, FNP 09/30/24 1425  "

## 2024-09-30 NOTE — ED Triage Notes (Signed)
 Pt c/o left lower sharp abdominal pain, he has nausea and diarrhea started on Thursday. Pt thinks he may have a knot at the area of pain.

## 2024-09-30 NOTE — Discharge Instructions (Addendum)
 Left lower and upper quadrant abdominal pain, nausea and vomiting, diarrhea, possible diverticulitis: Abdominal x-ray shows a moderate stool burden and some gas.  No signs of bowel obstruction.  Use ondansetron , 4 mg, every 8 hours if needed for nausea and vomiting.  Eat a bland diet.  Augmentin , 875 mg-125 mg, 1 pill twice daily for 10 days for possible diverticulitis.  Take the Augmentin  with food.  Consider probiotics to prevent antibiotic related diarrhea.  Get plenty of fluids and rest.  See below for signs and symptoms of worsening condition and reasons to go to an emergency room.  Follow-up if symptoms do not improve, worsen or new symptoms occur.  But patient may need to go to an emergency room where he could get a CT scan.  Contact a health care provider if: Your pain gets worse. Your pooping does not go back to normal. Your symptoms do not get better with treatment. Your symptoms get worse all of a sudden. You have a fever. You vomit more than one time. Your poop is bloody, black, or tarry. This information is not intended to replace advice given to you by your health care provider. Make sure you discuss any questions you have with your health care provider.

## 2024-10-01 ENCOUNTER — Ambulatory Visit (HOSPITAL_BASED_OUTPATIENT_CLINIC_OR_DEPARTMENT_OTHER): Payer: Self-pay | Admitting: Family Medicine

## 2024-10-01 NOTE — Progress Notes (Signed)
 Abdominal x-ray is negative.  Patient was updated of these results during his visit.

## 2024-12-16 ENCOUNTER — Encounter: Admitting: Family Medicine
